# Patient Record
Sex: Female | Born: 1985 | Race: White | Hispanic: No | Marital: Married | Smoking: Never smoker
Health system: Southern US, Community
[De-identification: ages and names within clinical notes are randomized; demographics above are authoritative.]

## PROBLEM LIST (undated history)

## (undated) DIAGNOSIS — Z22330 Carrier of Group B streptococcus: Secondary | ICD-10-CM

## (undated) DIAGNOSIS — E669 Obesity, unspecified: Secondary | ICD-10-CM

## (undated) DIAGNOSIS — J302 Other seasonal allergic rhinitis: Secondary | ICD-10-CM

## (undated) HISTORY — DX: Carrier of group B Streptococcus: Z22.330

## (undated) HISTORY — DX: Obesity, unspecified: E66.9

## (undated) HISTORY — DX: Other seasonal allergic rhinitis: J30.2

---
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
## 2005-08-07 ENCOUNTER — Emergency Department: Payer: Self-pay | Admitting: Unknown Physician Specialty

## 2005-08-13 ENCOUNTER — Emergency Department: Payer: Self-pay | Admitting: Emergency Medicine

## 2006-12-02 ENCOUNTER — Ambulatory Visit: Payer: Self-pay | Admitting: Internal Medicine

## 2007-01-07 ENCOUNTER — Ambulatory Visit: Payer: Self-pay | Admitting: Family Medicine

## 2009-02-20 ENCOUNTER — Ambulatory Visit: Payer: Self-pay | Admitting: General Practice

## 2009-02-20 ENCOUNTER — Ambulatory Visit: Payer: Self-pay | Admitting: Physician Assistant

## 2011-02-15 IMAGING — CT CT CHEST W/ CM
2 series · 16 of 31 positions shown, 20 images · non-contrast
Comparison: none

REASON FOR EXAM: call report [id number]  Tachycardia  SOB  positve high D Dimer
COMMENTS:

PROCEDURE:     CT  - CT CHEST (FOR PE) W  - February 20, 2009  [DATE]
RESULT:     Chest CT dated 02/20/2009
TECHNIQUE: Helical 3 mm sections were obtained from the thoracic inlet to
the lung bases status post intravenous administration of 100 mL of
[id number].

[Series 4: soft tissue · axial · 0.74mm/px · z∈[-1129,-1048]mm · 3 of 89 slices shown]
[im 7/89  mediastinal]
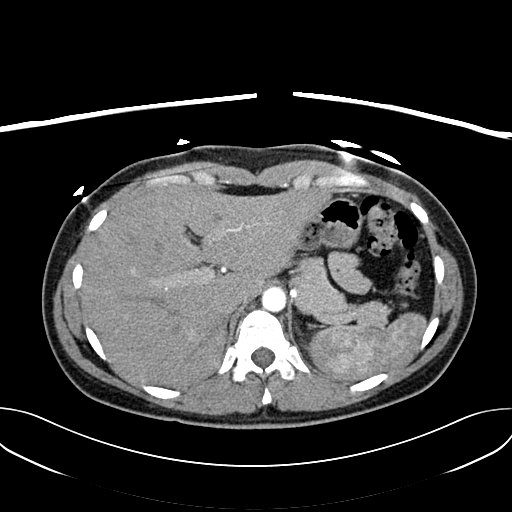
[im 21/89  mediastinal]
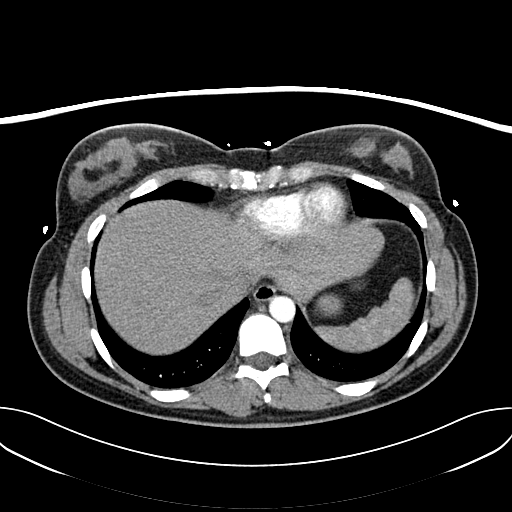
[im 34/89  mediastinal]
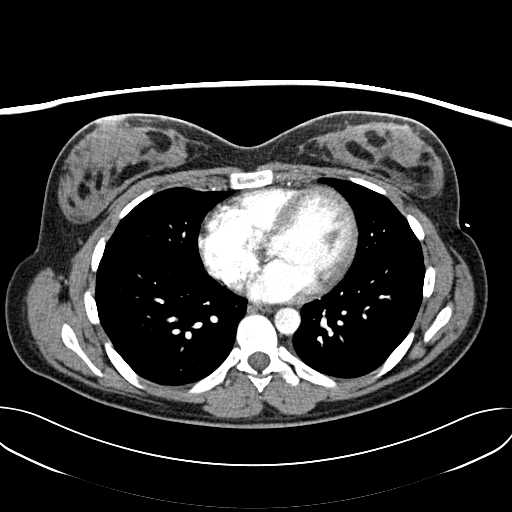

[Series 5: lung windows · axial · 0.74mm/px · z∈[-1123,-904]mm · 13 of 87 slices shown, 17 images]
[im 7/87  mediastinal]
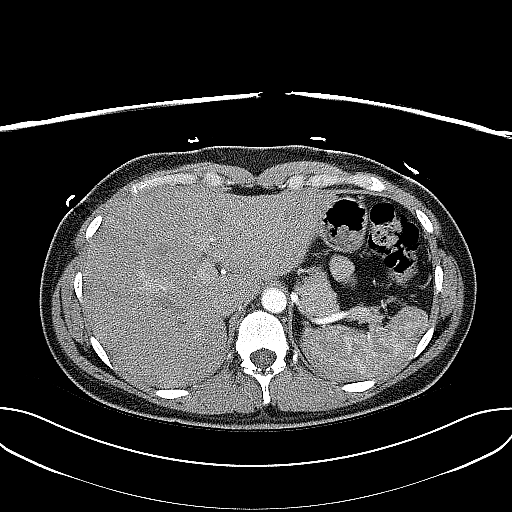
[im 7/87  lung]
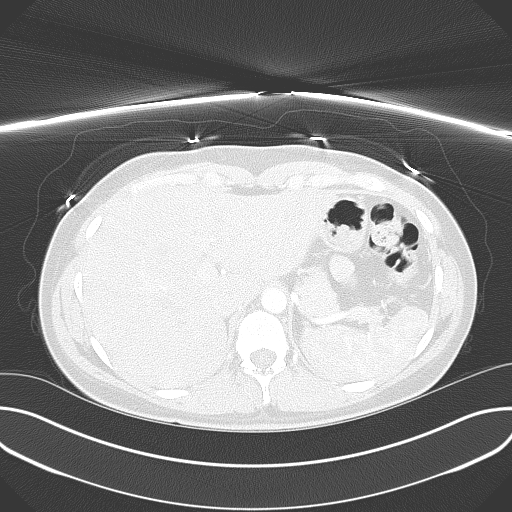
[im 14/87  lung]
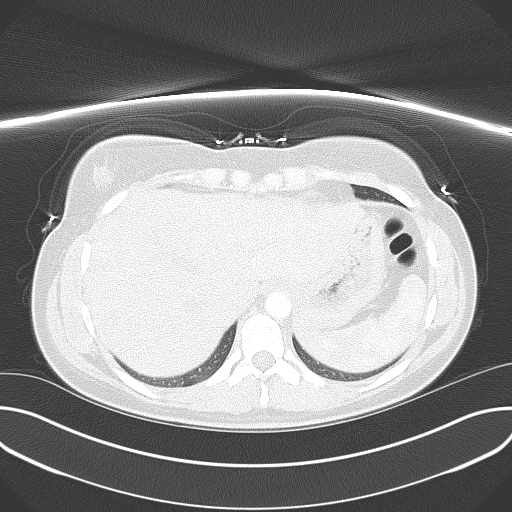
[im 20/87  lung]
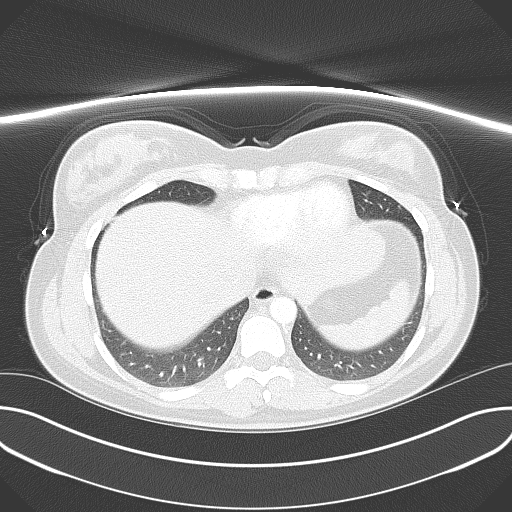
[im 27/87  lung]
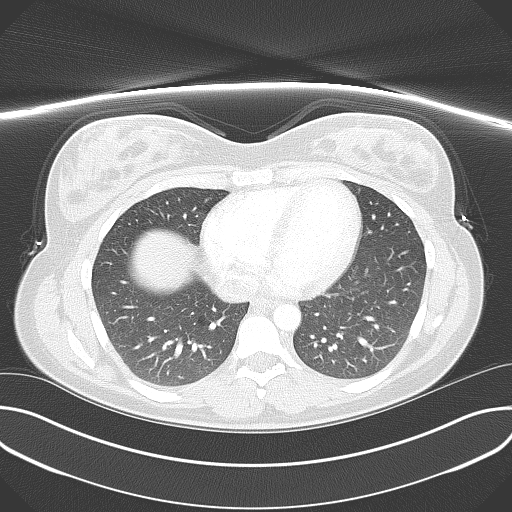
[im 34/87  mediastinal]
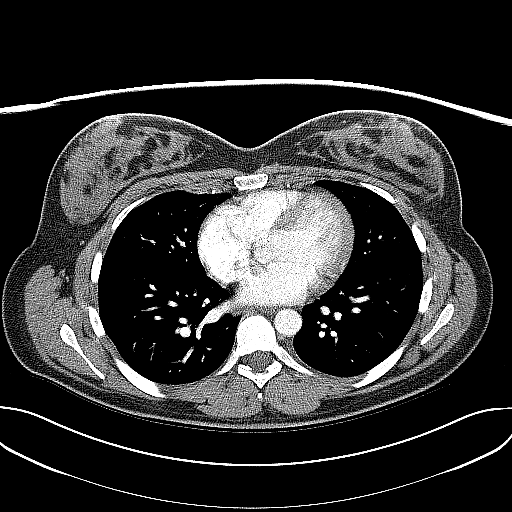
[im 34/87  lung]
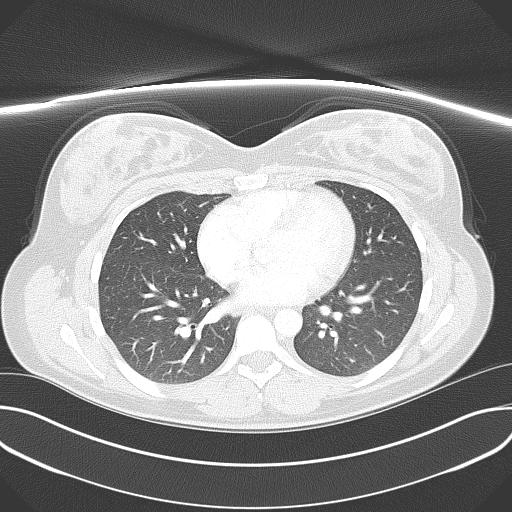
[im 40/87  lung]
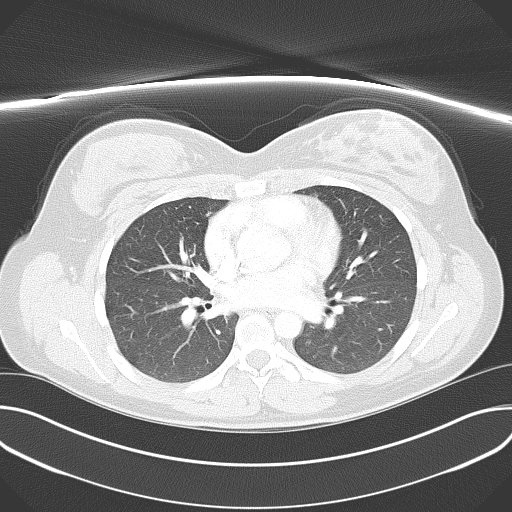
[im 44/87  lung]
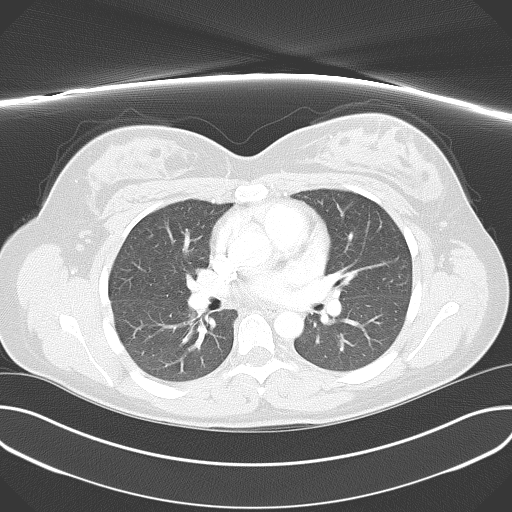
[im 47/87  lung]
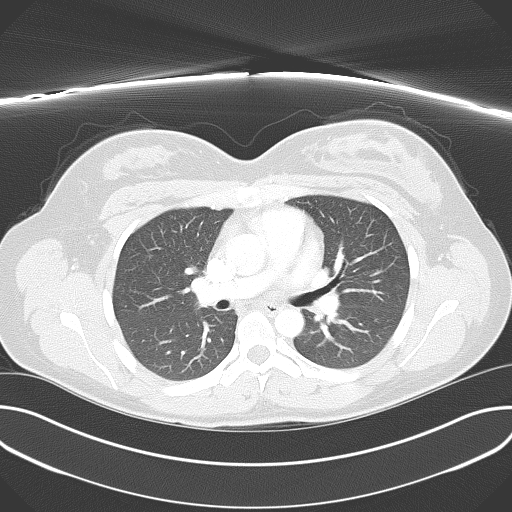
[im 53/87  mediastinal]
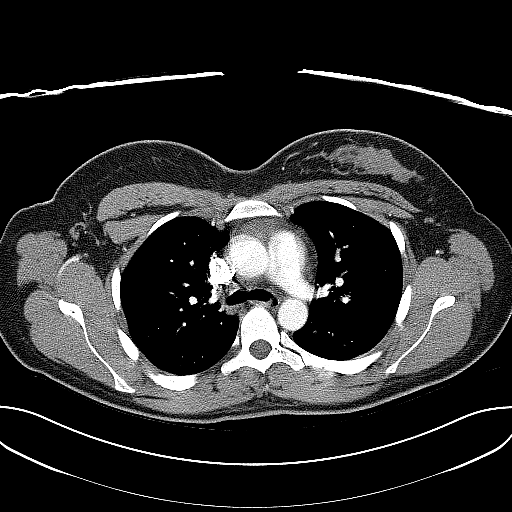
[im 53/87  lung]
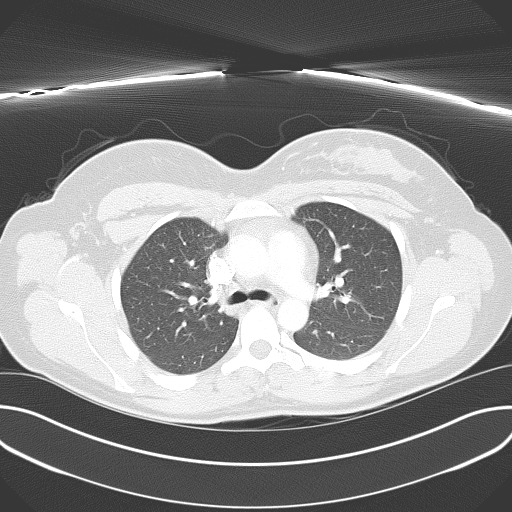
[im 60/87  lung]
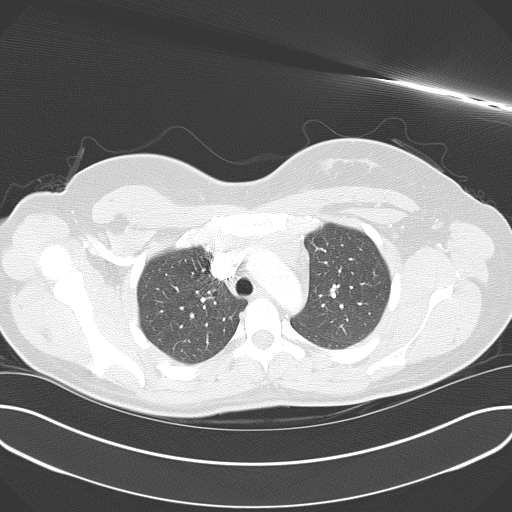
[im 67/87  lung]
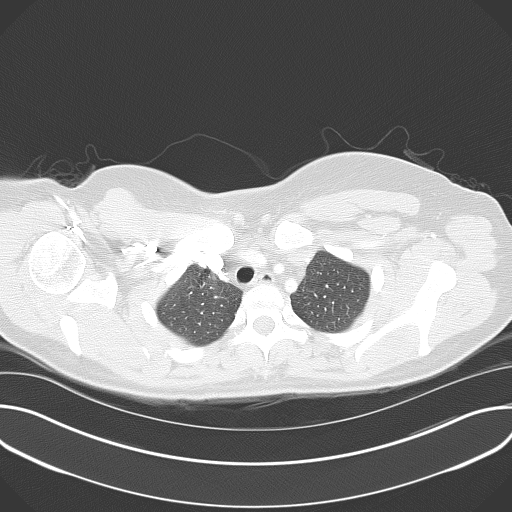
[im 73/87  lung]
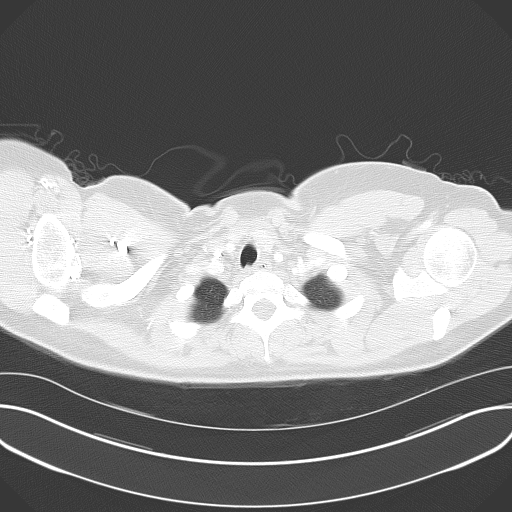
[im 80/87  mediastinal]
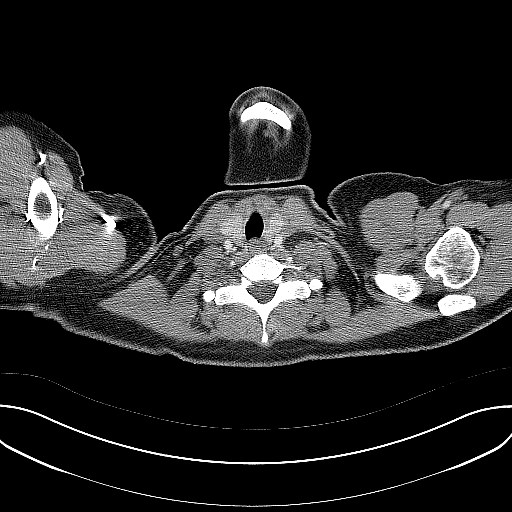
[im 80/87  lung]
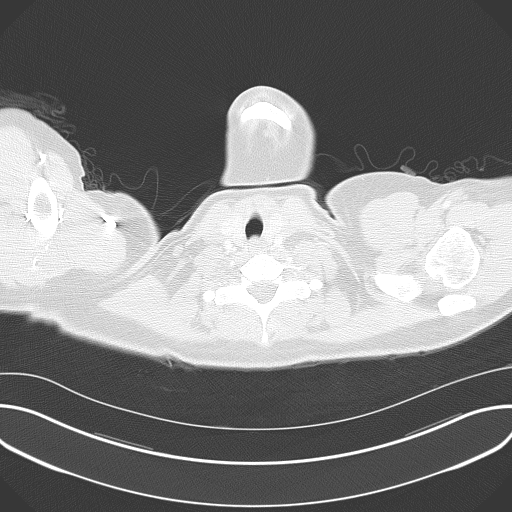

[16 of 31 positions shown; findings below may reference images not displayed]

FINDINGS: Evaluation of the mediastinum and hilar regions and structures demonstrates
no evidence of mediastinal nor hilar adenopathy nor masses. There is no
evidence of filling defects within the main, lobar, or segmental pulmonary
arteries.

The lung parenchyma demonstrate no evidence of focal infiltrates, effusions,
edema, masses, nor nodules.

The visualized upper abdominal viscera demonstrate no gross abnormalities.
IMPRESSION: No evidence of focal acute intrathoracic abnormalities.
2. There is no CT evidence of pulmonary arterial embolic disease.

## 2011-05-05 ENCOUNTER — Inpatient Hospital Stay: Payer: Self-pay | Admitting: Obstetrics and Gynecology

## 2011-05-05 LAB — CBC WITH DIFFERENTIAL/PLATELET
Basophil #: 0 10*3/uL (ref 0.0–0.1)
Eosinophil #: 0.2 10*3/uL (ref 0.0–0.7)
Lymphocyte #: 2.7 10*3/uL (ref 1.0–3.6)
MCV: 87 fL (ref 80–100)
Monocyte #: 1.2 10*3/uL — ABNORMAL HIGH (ref 0.0–0.7)
Monocyte %: 7.7 %
Neutrophil #: 11.7 10*3/uL — ABNORMAL HIGH (ref 1.4–6.5)
Neutrophil %: 73.5 %
Platelet: 277 10*3/uL (ref 150–440)
RBC: 3.77 10*6/uL — ABNORMAL LOW (ref 3.80–5.20)
RDW: 15.7 % — ABNORMAL HIGH (ref 11.5–14.5)
WBC: 15.9 10*3/uL — ABNORMAL HIGH (ref 3.6–11.0)

## 2014-02-23 NOTE — L&D Delivery Note (Signed)
Delivery Note At  a viable but suppressed female was delivered via  (Presentation:OP; with loose NCx1 reduced on perineum ).  APGAR:1 ,6, 9; weight 9#3oz  .   Placenta status:delivered intact , with 3 vessel Cord:  with the following complications:floopy with low heart rate, cord cut and placed on warmer and PPV applied vie SCN staff .  Cord pH: sent to lab  Anesthesia:  none Episiotomy:  none Lacerations:  2nd degree Suture Repair: 3.0 vicryl rapide Est. Blood Loss (mL):  300  Mom to postpartum.  Baby to warmer in room till stable then skin/skin care.  Leander Tout Burr 10/06/2014, 12:22 PM

## 2014-03-09 LAB — OB RESULTS CONSOLE HIV ANTIBODY (ROUTINE TESTING): HIV: NONREACTIVE

## 2014-03-09 LAB — OB RESULTS CONSOLE VARICELLA ZOSTER ANTIBODY, IGG: VARICELLA IGG: IMMUNE

## 2014-03-09 LAB — OB RESULTS CONSOLE ANTIBODY SCREEN: Antibody Screen: NEGATIVE

## 2014-03-09 LAB — OB RESULTS CONSOLE ABO/RH: RH Type: POSITIVE

## 2014-03-09 LAB — OB RESULTS CONSOLE HEPATITIS B SURFACE ANTIGEN: Hepatitis B Surface Ag: NEGATIVE

## 2014-03-09 LAB — OB RESULTS CONSOLE PLATELET COUNT: PLATELETS: 393 10*3/uL

## 2014-03-09 LAB — OB RESULTS CONSOLE HGB/HCT, BLOOD
HEMATOCRIT: 37 %
HEMOGLOBIN: 12 g/dL

## 2014-03-09 LAB — OB RESULTS CONSOLE GC/CHLAMYDIA
CHLAMYDIA, DNA PROBE: NEGATIVE
GC PROBE AMP, GENITAL: NEGATIVE

## 2014-03-09 LAB — OB RESULTS CONSOLE RUBELLA ANTIBODY, IGM: RUBELLA: IMMUNE

## 2014-03-09 LAB — OB RESULTS CONSOLE RPR: RPR: NONREACTIVE

## 2014-06-04 ENCOUNTER — Encounter: Payer: Self-pay | Admitting: *Deleted

## 2014-06-10 ENCOUNTER — Observation Stay
Admit: 2014-06-10 | Disposition: A | Discharge status: Home / Self Care | Payer: Self-pay | Attending: Obstetrics and Gynecology | Admitting: Obstetrics and Gynecology

## 2014-06-10 LAB — CBC WITH DIFFERENTIAL/PLATELET
BASOS PCT: 0.2 %
Basophil #: 0 10*3/uL (ref 0.0–0.1)
Eosinophil #: 0 10*3/uL (ref 0.0–0.7)
Eosinophil %: 0.2 %
HCT: 31.7 % — AB (ref 35.0–47.0)
HGB: 10.3 g/dL — AB (ref 12.0–16.0)
LYMPHS PCT: 7.7 %
Lymphocyte #: 0.7 10*3/uL — ABNORMAL LOW (ref 1.0–3.6)
MCH: 27.7 pg (ref 26.0–34.0)
MCHC: 32.5 g/dL (ref 32.0–36.0)
MCV: 85 fL (ref 80–100)
MONO ABS: 0.6 x10 3/mm (ref 0.2–0.9)
MONOS PCT: 6 %
NEUTROS PCT: 85.9 %
Neutrophil #: 7.9 10*3/uL — ABNORMAL HIGH (ref 1.4–6.5)
Platelet: 307 10*3/uL (ref 150–440)
RBC: 3.72 10*6/uL — AB (ref 3.80–5.20)
RDW: 13.3 % (ref 11.5–14.5)
WBC: 9.2 10*3/uL (ref 3.6–11.0)

## 2014-06-10 LAB — URINALYSIS, COMPLETE
BILIRUBIN, UR: NEGATIVE
BILIRUBIN, UR: NEGATIVE
Bacteria: NONE SEEN
Glucose,UR: NEGATIVE mg/dL (ref 0–75)
Glucose,UR: NEGATIVE mg/dL (ref 0–75)
KETONE: NEGATIVE
Leukocyte Esterase: NEGATIVE
Nitrite: NEGATIVE
Nitrite: NEGATIVE
PH: 5 (ref 4.5–8.0)
PH: 6 (ref 4.5–8.0)
Protein: NEGATIVE
SPECIFIC GRAVITY: 1.031 (ref 1.003–1.030)
SPECIFIC GRAVITY: 1.017 (ref 1.003–1.030)

## 2014-06-12 LAB — URINE CULTURE

## 2014-07-03 NOTE — H&P (Signed)
L&D Evaluation:  History Expanded:   HPI 29 yo G1 P0, EDD of 05/15/11 per LMP and 6 week US. [facility] to care, 3 gestational sacs with 1 fetus at 7 week US (one sac and one fetus at 7514 week US). Pt presents today at 38 4/7 weeks with c/o ctx, was 3 cm on admission and is now 3-4 per RN check. No LOF or VB. Good FM.    Blood Type O positive    Group B Strep Results (Result >5wks must be treated as unknown) negative    Maternal HIV Negative    Maternal Syphilis Ab Nonreactive    Maternal Varicella Immune    Rubella Results immune    Maternal T-Dap Immune    Patient's Medical History No Chronic Illness     Patient's Surgical History arm surgery after fracture as a child     Medications Pre Natal Vitamins     Allergies NKDA    Social History none   Exam:   Vital Signs 130s/80s    General no apparent distress    Mental Status clear    Chest clear    Heart no murmur/gallop/rubs    Abdomen gravid, tender with contractions    Estimated Fetal Weight Average for gestational age    Edema 1+  Nonpitting     Pelvic no external lesions    Mebranes Intact    FHT normal rate with no decels, 135 minimal variability after IV narcotics    Ucx regular   Impression:   Impression active labor   Plan:   Plan monitor contractions and for cervical change    Comments Getting epidural, will check afterwards   Electronic Signatures: Vella KohlerBrothers, Valentin Benney K (CNM)  (Signed 12-Mar-13 08:33)  Authored: L&D Evaluation   Last Updated: 12-Mar-13 08:33 by Vella KohlerBrothers, Konner Saiz K (CNM)

## 2014-07-27 ENCOUNTER — Encounter: Payer: Self-pay | Admitting: Obstetrics and Gynecology

## 2014-07-31 ENCOUNTER — Other Ambulatory Visit: Payer: Self-pay | Admitting: Obstetrics and Gynecology

## 2014-07-31 DIAGNOSIS — O99012 Anemia complicating pregnancy, second trimester: Secondary | ICD-10-CM

## 2014-07-31 MED ORDER — FUSION PLUS PO CAPS: 1.0000 | ORAL_CAPSULE | ORAL | Status: DC

## 2014-07-31 NOTE — Progress Notes (Signed)
Quick Note:  Please let her know she passed her glucola, but is anemic, need additional iron daily if not already on it ______

## 2014-08-01 NOTE — Progress Notes (Signed)
Notified pt of results, rx sent to [facility]

## 2014-08-13 ENCOUNTER — Encounter: Payer: Self-pay | Admitting: *Deleted

## 2014-08-16 ENCOUNTER — Ambulatory Visit (INDEPENDENT_AMBULATORY_CARE_PROVIDER_SITE_OTHER): Payer: 59 | Admitting: Obstetrics and Gynecology

## 2014-08-16 VITALS — BP 129/78 | HR 94 | Wt 221.6 lb

## 2014-08-16 DIAGNOSIS — Z3493 Encounter for supervision of normal pregnancy, unspecified, third trimester: Secondary | ICD-10-CM

## 2014-08-16 LAB — POCT URINALYSIS DIPSTICK
Bilirubin, UA: NEGATIVE
GLUCOSE UA: NEGATIVE
Ketones, UA: NEGATIVE
Leukocytes, UA: NEGATIVE
NITRITE UA: NEGATIVE
PROTEIN UA: NEGATIVE
RBC UA: NEGATIVE
SPEC GRAV UA: 1.01
UROBILINOGEN UA: 0.2
pH, UA: 6.5

## 2014-08-16 NOTE — Progress Notes (Signed)
ROB- doing well, no concerns. Plans to name infant 'Addison', and plans permnant steralization PP but possibly will be spouse vs her- OK if has a c/s to tie tubes at that time.

## 2014-08-16 NOTE — Progress Notes (Signed)
Pt is still having some leg cramps

## 2014-08-28 ENCOUNTER — Encounter: Payer: Self-pay | Admitting: Obstetrics and Gynecology

## 2014-08-28 ENCOUNTER — Ambulatory Visit (INDEPENDENT_AMBULATORY_CARE_PROVIDER_SITE_OTHER): Payer: 59 | Admitting: Obstetrics and Gynecology

## 2014-08-28 VITALS — BP 114/81 | HR 93 | Wt 221.0 lb

## 2014-08-28 DIAGNOSIS — Z3493 Encounter for supervision of normal pregnancy, unspecified, third trimester: Secondary | ICD-10-CM

## 2014-08-28 LAB — POCT URINALYSIS DIPSTICK
BILIRUBIN UA: NEGATIVE
GLUCOSE UA: NEGATIVE
Ketones, UA: NEGATIVE
NITRITE UA: NEGATIVE
PH UA: 7
RBC UA: NEGATIVE
SPEC GRAV UA: 1.01
UROBILINOGEN UA: 0.2

## 2014-08-28 NOTE — Progress Notes (Signed)
Pt denies any changes   

## 2014-08-28 NOTE — Progress Notes (Signed)
ROB- doing well w/o concerns; plans epidural in labor, delivered first child at 38w w/o complications.

## 2014-09-14 ENCOUNTER — Encounter: Payer: Self-pay | Admitting: Obstetrics and Gynecology

## 2014-09-14 ENCOUNTER — Ambulatory Visit (INDEPENDENT_AMBULATORY_CARE_PROVIDER_SITE_OTHER): Payer: 59 | Admitting: Obstetrics and Gynecology

## 2014-09-14 VITALS — BP 118/80 | HR 92 | Wt 225.7 lb

## 2014-09-14 DIAGNOSIS — Z3685 Encounter for antenatal screening for Streptococcus B: Secondary | ICD-10-CM

## 2014-09-14 DIAGNOSIS — Z113 Encounter for screening for infections with a predominantly sexual mode of transmission: Secondary | ICD-10-CM

## 2014-09-14 DIAGNOSIS — Z3493 Encounter for supervision of normal pregnancy, unspecified, third trimester: Secondary | ICD-10-CM

## 2014-09-14 DIAGNOSIS — Z36 Encounter for antenatal screening of mother: Secondary | ICD-10-CM

## 2014-09-14 LAB — POCT URINALYSIS DIPSTICK
BILIRUBIN UA: NEGATIVE
Blood, UA: NEGATIVE
Glucose, UA: NEGATIVE
Ketones, UA: NEGATIVE
LEUKOCYTES UA: NEGATIVE
NITRITE UA: NEGATIVE
PH UA: 6.5
Protein, UA: NEGATIVE
SPEC GRAV UA: 1.01
Urobilinogen, UA: 0.2

## 2014-09-14 LAB — OB RESULTS CONSOLE GBS: STREP GROUP B AG: POSITIVE

## 2014-09-14 NOTE — Progress Notes (Signed)
Pt denies any complaints.

## 2014-09-14 NOTE — Progress Notes (Signed)
ROB- doing well, reports irregular BHC, labor precautions discussed, cultures obtained

## 2014-09-15 LAB — GC/CHLAMYDIA PROBE AMP
CHLAMYDIA, DNA PROBE: NEGATIVE
Neisseria gonorrhoeae by PCR: NEGATIVE

## 2014-09-16 LAB — STREP GP B NAA: STREP GROUP B AG: POSITIVE — AB

## 2014-09-18 ENCOUNTER — Other Ambulatory Visit: Payer: Self-pay | Admitting: Obstetrics and Gynecology

## 2014-09-18 DIAGNOSIS — O99820 Streptococcus B carrier state complicating pregnancy: Secondary | ICD-10-CM

## 2014-09-21 ENCOUNTER — Ambulatory Visit (INDEPENDENT_AMBULATORY_CARE_PROVIDER_SITE_OTHER): Payer: 59 | Admitting: Obstetrics and Gynecology

## 2014-09-21 ENCOUNTER — Encounter: Payer: Self-pay | Admitting: Obstetrics and Gynecology

## 2014-09-21 VITALS — BP 109/78 | HR 112 | Wt 223.5 lb

## 2014-09-21 DIAGNOSIS — Z3493 Encounter for supervision of normal pregnancy, unspecified, third trimester: Secondary | ICD-10-CM

## 2014-09-21 LAB — POCT URINALYSIS DIPSTICK
BILIRUBIN UA: NEGATIVE
GLUCOSE UA: NEGATIVE
KETONES UA: NEGATIVE
NITRITE UA: NEGATIVE
PH UA: 6.5
Spec Grav, UA: 1.01
Urobilinogen, UA: 0.2

## 2014-09-21 NOTE — Progress Notes (Signed)
Pt is c/o scratchy throat, slight achy body, some swelling in feet especially @ work

## 2014-09-21 NOTE — Progress Notes (Signed)
ROB-reviewed GBS + status and antibiotic use in labor, to push fluids, keep taking zyrtec and tylenol for cold symptoms;

## 2014-09-27 ENCOUNTER — Ambulatory Visit (INDEPENDENT_AMBULATORY_CARE_PROVIDER_SITE_OTHER): Payer: 59 | Admitting: Obstetrics and Gynecology

## 2014-09-27 ENCOUNTER — Encounter: Payer: Self-pay | Admitting: Obstetrics and Gynecology

## 2014-09-27 VITALS — BP 106/76 | HR 110 | Wt 227.2 lb

## 2014-09-27 DIAGNOSIS — Z3493 Encounter for supervision of normal pregnancy, unspecified, third trimester: Secondary | ICD-10-CM

## 2014-09-27 LAB — POCT URINALYSIS DIPSTICK
BILIRUBIN UA: NEGATIVE
Glucose, UA: NEGATIVE
Ketones, UA: NEGATIVE
Leukocytes, UA: NEGATIVE
Nitrite, UA: NEGATIVE
PH UA: 5
PROTEIN UA: NEGATIVE
SPEC GRAV UA: 1.01
Urobilinogen, UA: 0.2

## 2014-09-27 NOTE — Progress Notes (Signed)
ROB- sinus pressure is biggest complaint, labor precautions reiterated.

## 2014-09-27 NOTE — Progress Notes (Signed)
Pt saw Dr.Mcqueen 09/26/2014 for sinus issues, some low back pain Has a lot of green mucous and isnt sleeping at all

## 2014-10-05 ENCOUNTER — Ambulatory Visit (INDEPENDENT_AMBULATORY_CARE_PROVIDER_SITE_OTHER): Payer: 59 | Admitting: Obstetrics and Gynecology

## 2014-10-05 ENCOUNTER — Encounter: Payer: Self-pay | Admitting: Obstetrics and Gynecology

## 2014-10-05 VITALS — BP 117/82 | HR 94 | Wt 228.0 lb

## 2014-10-05 DIAGNOSIS — Z3493 Encounter for supervision of normal pregnancy, unspecified, third trimester: Secondary | ICD-10-CM

## 2014-10-05 LAB — POCT URINALYSIS DIPSTICK
Bilirubin, UA: NEGATIVE
Glucose, UA: NEGATIVE
KETONES UA: NEGATIVE
Leukocytes, UA: NEGATIVE
Nitrite, UA: NEGATIVE
PROTEIN UA: NEGATIVE
RBC UA: NEGATIVE
SPEC GRAV UA: 1.01
Urobilinogen, UA: 0.2
pH, UA: 6.5

## 2014-10-05 NOTE — Progress Notes (Signed)
ROB-pt has been having contractions all am, pelvic pressure

## 2014-10-05 NOTE — Progress Notes (Signed)
ROB- reports irreg contractions all morning, labor precautions reiterated

## 2014-10-06 ENCOUNTER — Inpatient Hospital Stay
Admission: RE | Admit: 2014-10-06 | Discharge: 2014-10-08 | DRG: 775 | Disposition: A | Discharge status: Home / Self Care | Payer: 59 | Source: Ambulatory Visit | Attending: Obstetrics and Gynecology | Admitting: Obstetrics and Gynecology

## 2014-10-06 DIAGNOSIS — Z3A38 38 weeks gestation of pregnancy: Secondary | ICD-10-CM | POA: Diagnosis present

## 2014-10-06 DIAGNOSIS — Z79899 Other long term (current) drug therapy: Secondary | ICD-10-CM

## 2014-10-06 DIAGNOSIS — O9081 Anemia of the puerperium: Secondary | ICD-10-CM | POA: Diagnosis present

## 2014-10-06 DIAGNOSIS — Z888 Allergy status to other drugs, medicaments and biological substances status: Secondary | ICD-10-CM | POA: Diagnosis not present

## 2014-10-06 DIAGNOSIS — O99824 Streptococcus B carrier state complicating childbirth: Secondary | ICD-10-CM | POA: Diagnosis present

## 2014-10-06 DIAGNOSIS — Z3493 Encounter for supervision of normal pregnancy, unspecified, third trimester: Secondary | ICD-10-CM

## 2014-10-06 LAB — ABO/RH: ABO/RH(D): O POS

## 2014-10-06 LAB — TYPE AND SCREEN
ABO/RH(D): O POS
Antibody Screen: NEGATIVE

## 2014-10-06 LAB — CBC
HCT: 32.1 % — ABNORMAL LOW (ref 35.0–47.0)
HEMOGLOBIN: 10.6 g/dL — AB (ref 12.0–16.0)
MCH: 26.4 pg (ref 26.0–34.0)
MCHC: 33.1 g/dL (ref 32.0–36.0)
MCV: 79.7 fL — ABNORMAL LOW (ref 80.0–100.0)
PLATELETS: 421 10*3/uL (ref 150–440)
RBC: 4.03 MIL/uL (ref 3.80–5.20)
RDW: 15.4 % — ABNORMAL HIGH (ref 11.5–14.5)
WBC: 16 10*3/uL — AB (ref 3.6–11.0)

## 2014-10-06 MED ORDER — OXYCODONE-ACETAMINOPHEN 5-325 MG PO TABS
1.0000 | ORAL_TABLET | ORAL | Status: DC | PRN
  Administered 2014-10-07 (×2): 1 via ORAL
  Filled 2014-10-06 (×2): qty 1

## 2014-10-06 MED ORDER — OXYTOCIN 40 UNITS IN LACTATED RINGERS INFUSION - SIMPLE MED
62.5000 mL/h | INTRAVENOUS | Status: DC
  Administered 2014-10-06: 999 mL/h via INTRAVENOUS
  Filled 2014-10-06: qty 1000

## 2014-10-06 MED ORDER — OXYCODONE-ACETAMINOPHEN 5-325 MG PO TABS
1.0000 | ORAL_TABLET | ORAL | Status: DC | PRN
  Administered 2014-10-06: 1 via ORAL
  Filled 2014-10-06: qty 1

## 2014-10-06 MED ORDER — LACTATED RINGERS IV SOLN: 500.0000 mL | INTRAVENOUS | Status: DC | PRN

## 2014-10-06 MED ORDER — ACETAMINOPHEN 325 MG PO TABS: 650.0000 mg | ORAL_TABLET | ORAL | Status: DC | PRN

## 2014-10-06 MED ORDER — AMMONIA AROMATIC IN INHA
RESPIRATORY_TRACT | Status: AC
  Filled 2014-10-06: qty 10

## 2014-10-06 MED ORDER — OXYTOCIN BOLUS FROM INFUSION: 500.0000 mL | INTRAVENOUS | Status: DC

## 2014-10-06 MED ORDER — BENZOCAINE-MENTHOL 20-0.5 % EX AERO: 1.0000 "application " | INHALATION_SPRAY | CUTANEOUS | Status: DC | PRN

## 2014-10-06 MED ORDER — SIMETHICONE 80 MG PO CHEW: 80.0000 mg | CHEWABLE_TABLET | ORAL | Status: DC | PRN

## 2014-10-06 MED ORDER — OXYCODONE-ACETAMINOPHEN 5-325 MG PO TABS
2.0000 | ORAL_TABLET | ORAL | Status: DC | PRN
  Administered 2014-10-06 – 2014-10-08 (×3): 2 via ORAL
  Filled 2014-10-06 (×2): qty 2

## 2014-10-06 MED ORDER — ONDANSETRON HCL 4 MG PO TABS: 4.0000 mg | ORAL_TABLET | ORAL | Status: DC | PRN

## 2014-10-06 MED ORDER — WITCH HAZEL-GLYCERIN EX PADS: 1.0000 "application " | MEDICATED_PAD | CUTANEOUS | Status: DC | PRN

## 2014-10-06 MED ORDER — ONDANSETRON HCL 4 MG/2ML IJ SOLN: 4.0000 mg | Freq: Four times a day (QID) | INTRAMUSCULAR | Status: DC | PRN

## 2014-10-06 MED ORDER — SODIUM CHLORIDE 0.9 % IV SOLN
2.0000 g | Freq: Once | INTRAVENOUS | Status: AC
  Administered 2014-10-06: 2 g via INTRAVENOUS

## 2014-10-06 MED ORDER — BENZOCAINE-MENTHOL 20-0.5 % EX AERO
INHALATION_SPRAY | CUTANEOUS | Status: AC
  Filled 2014-10-06: qty 56

## 2014-10-06 MED ORDER — OXYTOCIN 10 UNIT/ML IJ SOLN
INTRAMUSCULAR | Status: AC
  Filled 2014-10-06: qty 2

## 2014-10-06 MED ORDER — IBUPROFEN 600 MG PO TABS
600.0000 mg | ORAL_TABLET | Freq: Four times a day (QID) | ORAL | Status: DC
  Administered 2014-10-06 – 2014-10-08 (×8): 600 mg via ORAL
  Filled 2014-10-06 (×8): qty 1

## 2014-10-06 MED ORDER — LACTATED RINGERS IV SOLN
INTRAVENOUS | Status: DC
  Administered 2014-10-06: 11:00:00 via INTRAVENOUS

## 2014-10-06 MED ORDER — LANOLIN HYDROUS EX OINT: TOPICAL_OINTMENT | CUTANEOUS | Status: DC | PRN

## 2014-10-06 MED ORDER — SODIUM CHLORIDE 0.9 % IV SOLN
1.0000 g | Freq: Four times a day (QID) | INTRAVENOUS | Status: DC
  Filled 2014-10-06 (×4): qty 1000

## 2014-10-06 MED ORDER — SODIUM CHLORIDE 0.9 % IV SOLN
INTRAVENOUS | Status: AC
  Administered 2014-10-06: 2 g via INTRAVENOUS
  Filled 2014-10-06: qty 2000

## 2014-10-06 MED ORDER — DIPHENHYDRAMINE HCL 25 MG PO CAPS: 25.0000 mg | ORAL_CAPSULE | Freq: Four times a day (QID) | ORAL | Status: DC | PRN

## 2014-10-06 MED ORDER — PRENATAL MULTIVITAMIN CH
1.0000 | ORAL_TABLET | Freq: Every day | ORAL | Status: DC
  Filled 2014-10-06 (×2): qty 1

## 2014-10-06 MED ORDER — DIBUCAINE 1 % RE OINT: 1.0000 "application " | TOPICAL_OINTMENT | RECTAL | Status: DC | PRN

## 2014-10-06 MED ORDER — MISOPROSTOL 200 MCG PO TABS
ORAL_TABLET | ORAL | Status: AC
  Filled 2014-10-06: qty 4

## 2014-10-06 MED ORDER — OXYCODONE-ACETAMINOPHEN 5-325 MG PO TABS
2.0000 | ORAL_TABLET | ORAL | Status: DC | PRN
  Filled 2014-10-06: qty 2

## 2014-10-06 MED ORDER — ONDANSETRON HCL 4 MG/2ML IJ SOLN: 4.0000 mg | INTRAMUSCULAR | Status: DC | PRN

## 2014-10-06 MED ORDER — OXYTOCIN 40 UNITS IN LACTATED RINGERS INFUSION - SIMPLE MED: 62.5000 mL/h | INTRAVENOUS | Status: DC | PRN

## 2014-10-06 MED ORDER — SENNOSIDES-DOCUSATE SODIUM 8.6-50 MG PO TABS
2.0000 | ORAL_TABLET | ORAL | Status: DC
  Administered 2014-10-06 – 2014-10-08 (×2): 2 via ORAL
  Filled 2014-10-06 (×2): qty 2

## 2014-10-06 MED ORDER — LIDOCAINE HCL (PF) 1 % IJ SOLN
INTRAMUSCULAR | Status: AC
  Filled 2014-10-06: qty 30

## 2014-10-06 NOTE — Progress Notes (Signed)
Pt transferred to m/b unit.  Vital signs stable, fundus firm, bleeding WNL. Report given to Shirlean Kelly, RN.

## 2014-10-06 NOTE — H&P (Signed)
Obstetric History and Physical  Michele Hurley is a 29 y.o. G2P1001 with IUP at [redacted]w[redacted]d presenting with contractions. Patient states she has been having  regular, every 4 minutes contractions, minimal vaginal bleeding, intact membranes, with active fetal movement.    Prenatal Course Source of Care: [facility]  Pregnancy complications or risks:+GBS  Prenatal labs and studies: ABO, Rh: --/--/PENDING (08/13 1058) Antibody: PENDING (08/13 1058) Rubella: Immune (01/15 0000) RPR: Nonreactive (01/15 0000)  HBsAg: Negative (01/15 0000)  HIV: Non-reactive (01/15 0000)  GBS:  1 hr Glucola  normal Genetic screening normal Anatomy US normal  Past Medical History  Diagnosis Date  . GBS carrier   . Obesity   . Seasonal allergies     No past surgical history on file.  OB History  Gravida Para Term Preterm AB SAB TAB Ectopic Multiple Living  # Outcome Date GA Lbr Len/2nd Weight Sex Delivery Anes PTL Lv  2 Current           1 Term 2013    M Vag-Spont   Y      Social History   Social History  . Marital Status: Married    Spouse Name: N/A  . Number of Children: N/A  . Years of Education: N/A   Social History Main Topics  . Smoking status: Never Smoker   . Smokeless tobacco: Never Used  . Alcohol Use: No  . Drug Use: No  . Sexual Activity: Yes    Birth Control/ Protection: None   Other Topics Concern  . Not on file   Social History Narrative    No family history on file.  Prescriptions prior to admission  Medication Sig Dispense Refill Last Dose  . cefdinir (OMNICEF) 300 MG capsule Take 300 mg by mouth 2 (two) times daily.   Not Taking  . docusate sodium (COLACE) 100 MG capsule Take 100 mg by mouth 2 (two) times daily.   Taking  . Iron-FA-B Cmp-C-Biot-Probiotic (FUSION PLUS) CAPS Take 1 capsule by mouth 1 day or 1 dose. 30 capsule 3 Taking  . Prenatal Vit-Fe Fumarate-FA (PRENATAL MULTIVITAMIN) TABS tablet Take 1 tablet by mouth daily at 12 noon.   Taking     Allergies  Allergen Reactions  . Ceclor [Cefaclor] Rash    Review of Systems: Negative except for what is mentioned in HPI.  Physical Exam: BP 130/87 mmHg  Pulse 110  Temp(Src) 98.3 F (36.8 C) (Oral)  Resp 20  Ht  (1.676 m)  Wt 103.42 kg (228 lb)  BMI 36.82 kg/m2  LMP 01/07/2014 (LMP Unknown) GENERAL: Well-developed, well-nourished female in no acute distress.  LUNGS: Clear to auscultation bilaterally.  HEART: Regular rate and rhythm. ABDOMEN: Soft, nontender, nondistended, gravid. EXTREMITIES: Nontender, no edema, 2+ distal pulses. Cervical Exam: Dilation: 7.5 Effacement (%): 90 Station: -1 Presentation: Vertex Exam by:: Zerita Boers  FHT:  Baseline rate 140 bpm   Variability moderate  Accelerations present   Decelerations early Contractions: Every 3 mins   Pertinent Labs/Studies:   Results for orders placed or performed during the hospital encounter of 10/06/14 (from the past 24 hour(s))  Type and screen     Status: None (Preliminary result)   Collection Time: 10/06/14 10:58 AM  Result Value Ref Range   ABO/RH(D) PENDING    Antibody Screen PENDING    Sample Expiration 10/09/2014   CBC     Status: Abnormal   Collection Time: 10/06/14  10:59 AM  Result Value Ref Range   WBC 16.0 (H) 3.6 - 11.0 K/uL   RBC 4.03 3.80 - 5.20 MIL/uL   Hemoglobin 10.6 (L) 12.0 - 16.0 g/dL   HCT 96.0 (L) 45.4 - 09.8 %   MCV 79.7 (L) 80.0 - 100.0 fL   MCH 26.4 26.0 - 34.0 pg   MCHC 33.1 32.0 - 36.0 g/dL   RDW 11.9 (H) 14.7 - 82.9 %   Platelets 421 150 - 440 K/uL    Assessment : Michele Hurley is a 29 y.o. G2P1001 at [redacted]w[redacted]d being admitted for labor.  Plan: Labor: Expectant management.  Induction/Augmentation as needed, per protocol FWB: Reassuring fetal heart tracing.  GBS positive Delivery plan: Hopeful for vaginal delivery  Melody Ines Bloomer, CNM [facility], [facility]

## 2014-10-06 NOTE — OB Triage Note (Signed)
[facility] with contractions since last night, increasing in intensity at around 10AM. Denies LOF, reports some spotting. Rates pain 8/10, pain primarily in back.

## 2014-10-07 LAB — CBC
HEMATOCRIT: 26.5 % — AB (ref 35.0–47.0)
HEMOGLOBIN: 8.4 g/dL — AB (ref 12.0–16.0)
MCH: 25.3 pg — ABNORMAL LOW (ref 26.0–34.0)
MCHC: 31.7 g/dL — AB (ref 32.0–36.0)
MCV: 79.9 fL — ABNORMAL LOW (ref 80.0–100.0)
Platelets: 336 10*3/uL (ref 150–440)
RBC: 3.32 MIL/uL — AB (ref 3.80–5.20)
RDW: 15.3 % — AB (ref 11.5–14.5)
WBC: 18.2 10*3/uL — AB (ref 3.6–11.0)

## 2014-10-07 LAB — RPR: RPR: NONREACTIVE

## 2014-10-07 NOTE — Progress Notes (Signed)
Post Partum Day 1 Subjective: no complaints, up ad lib and voiding  Objective: Blood pressure 107/69, pulse 78, temperature 98 F (36.7 C), temperature source Oral, resp. rate 14, height  (1.676 m), weight 103.42 kg (228 lb), last menstrual period 01/07/2014, SpO2 97 %, unknown if currently breastfeeding.  Physical Exam:  General: alert, cooperative and appears stated age Lochia: appropriate Uterine Fundus: firm Incision: NA DVT Evaluation: No evidence of DVT seen on physical exam. Negative Homan's sign.   Recent Labs  10/06/14 1059 10/07/14 0534  HGB 10.6* 8.4*  HCT 32.1* 26.5*    Assessment/Plan: Coomb's negative Plan for discharge tomorrow and Breastfeeding   LOS: 1 day   Michele Hurley 10/07/2014, 2:35 PM

## 2014-10-08 LAB — CBC
HEMATOCRIT: 27.2 % — AB (ref 35.0–47.0)
Hemoglobin: 8.8 g/dL — ABNORMAL LOW (ref 12.0–16.0)
MCH: 26.1 pg (ref 26.0–34.0)
MCHC: 32.2 g/dL (ref 32.0–36.0)
MCV: 81.1 fL (ref 80.0–100.0)
PLATELETS: 355 10*3/uL (ref 150–440)
RBC: 3.36 MIL/uL — ABNORMAL LOW (ref 3.80–5.20)
RDW: 15.3 % — AB (ref 11.5–14.5)
WBC: 11.3 10*3/uL — ABNORMAL HIGH (ref 3.6–11.0)

## 2014-10-08 NOTE — Discharge Summary (Signed)
Obstetric Discharge Summary Reason for Admission: onset of labor Prenatal Procedures: ultrasound Intrapartum Procedures: spontaneous vaginal delivery and GBS prophylaxis Postpartum Procedures: none Complications-Operative and Postpartum: 2 degree perineal laceration HEMOGLOBIN  Date Value Ref Range Status  10/08/2014 8.8* 12.0 - 16.0 g/dL Final  40/98/1191 47.8 g/dL Final   HGB  Date Value Ref Range Status  06/10/2014 10.3* 12.0-16.0 g/dL Final   HCT  Date Value Ref Range Status  10/08/2014 27.2* 35.0 - 47.0 % Final  06/10/2014 31.7* 35.0-47.0 % Final  03/09/2014 37 % Final    Physical Exam:  General: alert, cooperative, appears stated age and pale Lochia: appropriate Uterine Fundus: firm Incision: NA DVT Evaluation: No evidence of DVT seen on physical exam. Negative Homan's sign.  Discharge Diagnoses: Term Pregnancy-delivered and PP anemia  Discharge Information: Date: 10/08/2014 Activity: pelvic rest Diet: routine Medications: PNV, Ibuprofen, Colace and Iron Condition: stable Instructions: refer to practice specific booklet and to call and schedule appointment in 3-4 weeks for PPTL preop with one of physicians Discharge to: home   Newborn Data: Live born female Addison Birth Weight: 9 lb 3.4 oz (4179 g) APGAR: 1, 6  Home with mother.  Melody Burr 10/08/2014, 11:16 AM

## 2014-10-08 NOTE — Discharge Instructions (Signed)
Laparoscopic Tubal Ligation Laparoscopic tubal ligation is a procedure that closes the fallopian tubes at a time other than right after childbirth. By closing the fallopian tubes, the eggs that are released from the ovaries cannot enter the uterus and sperm cannot reach the egg. Tubal ligation is also known as getting your "tubes tied." Tubal ligation is done so you will not be able to get pregnant or have a baby.  Although this procedure may be reversed, it should be considered permanent and irreversible. If you want to have future pregnancies, you should not have this procedure.  LET YOUR CAREGIVER KNOW ABOUT:  Allergies to food or medicine.  Medicines taken, including vitamins, herbs, eyedrops, over-the-counter medicines, and creams.  Use of steroids (by mouth or creams).  Previous problems with numbing medicines.  History of bleeding problems or blood clots.  Any recent colds or infections.  Previous surgery.  Other health problems, including diabetes and kidney problems.  Possibility of pregnancy, if this applies.  Any past pregnancies. RISKS AND COMPLICATIONS   Infection.  Bleeding.  Injury to surrounding organs.  Anesthetic side effects.  Failure of the procedure.  Ectopic pregnancy.  Future regret about having the procedure done. BEFORE THE PROCEDURE  Do not take aspirin or blood thinners a week before the procedure or as directed. This can cause bleeding.  Do not eat or drink anything 6 to 8 hours before the procedure. PROCEDURE   You may be given a medicine to help you relax (sedative) before the procedure. You will be given a medicine to make you sleep (general anesthetic) during the procedure.  A tube will be put down your throat to help your breath while under general anesthesia.  Two small cuts (incisions) are made in the lower abdominal area and near the belly button.  Your abdominal area will be inflated with a safe gas (carbon dioxide). This helps  give the surgeon room to operate, visualize, and helps the surgeon avoid other organs.  A thin, lighted tube (laparoscope) with a camera attached is inserted into your abdomen through one of the incisions near the belly button. Other small instruments are also inserted through the other abdominal incision.  The fallopian tubes are located and are either blocked with a ring, clip, or are burned (cauterized).  After the fallopian tubes are blocked, the gas is released from the abdomen.  The incisions will be closed with stitches (sutures), and a bandage may be placed over the incisions. AFTER THE PROCEDURE   You will rest in a recovery room for 1--4 hours until you are stable and doing well.  You will also have some mild abdominal discomfort for 3--7 days. You will be given pain medicine to ease any discomfort.  As long as there are no problems, you may be allowed to go home. Someone will need to drive you home and be with you for at least 24 hours once home.  You may have some mild discomfort in the throat. This is from the tube placed in your throat while you were sleeping.  You may experience discomfort in the shoulder area from some trapped air between the liver and diaphragm. This sensation is normal and will slowly go away on its own. Document Released: 05/18/2000 Document Revised: 08/11/2011 Document Reviewed: 05/23/2011 ExitCare Patient Information 2015 [employer]. This information is not intended to replace advice given to you by your health care provider. Make sure you discuss any questions you have with your health care provider.  

## 2014-10-12 ENCOUNTER — Encounter: Payer: 59 | Admitting: Obstetrics and Gynecology

## 2014-12-04 ENCOUNTER — Encounter: Payer: Self-pay | Admitting: Obstetrics and Gynecology

## 2014-12-04 ENCOUNTER — Ambulatory Visit (INDEPENDENT_AMBULATORY_CARE_PROVIDER_SITE_OTHER): Payer: 59 | Admitting: Obstetrics and Gynecology

## 2014-12-04 MED ORDER — INFLUENZA VAC SPLIT QUAD 0.5 ML IM SUSY
0.5000 mL | PREFILLED_SYRINGE | Freq: Once | INTRAMUSCULAR | Status: AC
  Administered 2014-12-04: 0.5 mL via INTRAMUSCULAR

## 2014-12-04 NOTE — Progress Notes (Signed)
  Subjective:     Michele Hurley is a 29 y.o. female who presents for a postpartum visit. She is 6 weeks postpartum following a spontaneous vaginal delivery. I have fully reviewed the prenatal and intrapartum course. The delivery was at 39 gestational weeks. Outcome: spontaneous vaginal delivery. Anesthesia: epidural. Postpartum course has been uneventful. Baby's course has been uneventful. Baby is feeding by breast. Bleeding no bleeding. Bowel function is normal. Bladder function is normal. Patient is not sexually active. Contraception method is abstinence. Postpartum depression screening: negative.  The following portions of the patient's history were reviewed and updated as appropriate: allergies, current medications, past family history, past medical history, past social history, past surgical history and problem list.  Review of Systems A comprehensive review of systems was negative.   Objective:    BP 112/70 mmHg  Pulse 94  Ht  (1.676 m)  Wt 216 lb 3.2 oz (98.068 kg)  BMI 34.91 kg/m2  Breastfeeding? Yes  General:  alert, cooperative, appears stated age and mildly obese   Breasts:  inspection negative, no nipple discharge or bleeding, no masses or nodularity palpable  Lungs: clear to auscultation bilaterally  Heart:  regular rate and rhythm, S1, S2 normal, no murmur, click, rub or gallop  Abdomen: soft, non-tender; bowel sounds normal; no masses,  no organomegaly   Vulva:  normal  Vagina: normal vagina, no discharge, exudate, lesion, or erythema  Cervix:  multiparous appearance  Corpus: normal size, contour, position, consistency, mobility, non-tender  Adnexa:  normal adnexa and no mass, fullness, tenderness  Rectal Exam: Not performed.        Assessment:     6 week postpartum exam. Pap smear not done at today's visit.   Plan:    1. Contraception: IUD- will return next week for Mirena insertion 2. Obesity breastfeeding 3. Follow up in: 4 months for AE or as needed.

## 2014-12-04 NOTE — Patient Instructions (Signed)
Levonorgestrel intrauterine device (IUD) What is this medicine? LEVONORGESTREL IUD (LEE voe nor jes trel) is a contraceptive (birth control) device. The device is placed inside the uterus by a healthcare professional. It is used to prevent pregnancy and can also be used to treat heavy bleeding that occurs during your period. Depending on the device, it can be used for 3 to 5 years. This medicine may be used for other purposes; ask your health care provider or pharmacist if you have questions. What should I tell my health care provider before I take this medicine? They need to know if you have any of these conditions: -abnormal Pap smear -cancer of the breast, uterus, or cervix -diabetes -endometritis -genital or pelvic infection now or in the past -have more than one sexual partner or your partner has more than one partner -heart disease -history of an ectopic or tubal pregnancy -immune system problems -IUD in place -liver disease or tumor -problems with blood clots or take blood-thinners -use intravenous drugs -uterus of unusual shape -vaginal bleeding that has not been explained -an unusual or allergic reaction to levonorgestrel, other hormones, silicone, or polyethylene, medicines, foods, dyes, or preservatives -pregnant or trying to get pregnant -breast-feeding How should I use this medicine? This device is placed inside the uterus by a health care professional. Talk to your pediatrician regarding the use of this medicine in children. Special care may be needed. Overdosage: If you think you have taken too much of this medicine contact a poison control center or emergency room at once. NOTE: This medicine is only for you. Do not share this medicine with others. What if I miss a dose? This does not apply. What may interact with this medicine? Do not take this medicine with any of the following medications: -amprenavir -bosentan -fosamprenavir This medicine may also interact with  the following medications: -aprepitant -barbiturate medicines for inducing sleep or treating seizures -bexarotene -griseofulvin -medicines to treat seizures like carbamazepine, ethotoin, felbamate, oxcarbazepine, phenytoin, topiramate -modafinil -pioglitazone -rifabutin -rifampin -rifapentine -some medicines to treat HIV infection like atazanavir, indinavir, lopinavir, nelfinavir, tipranavir, ritonavir -St. John's wort -warfarin This list may not describe all possible interactions. Give your health care provider a list of all the medicines, herbs, non-prescription drugs, or dietary supplements you use. Also tell them if you smoke, drink alcohol, or use illegal drugs. Some items may interact with your medicine. What should I watch for while using this medicine? Visit your doctor or health care professional for regular check ups. See your doctor if you or your partner has sexual contact with others, becomes HIV positive, or gets a sexual transmitted disease. This product does not protect you against HIV infection (AIDS) or other sexually transmitted diseases. You can check the placement of the IUD yourself by reaching up to the top of your vagina with clean fingers to feel the threads. Do not pull on the threads. It is a good habit to check placement after each menstrual period. Call your doctor right away if you feel more of the IUD than just the threads or if you cannot feel the threads at all. The IUD may come out by itself. You may become pregnant if the device comes out. If you notice that the IUD has come out use a backup birth control method like condoms and call your health care provider. Using tampons will not change the position of the IUD and are okay to use during your period. What side effects may I notice from receiving this medicine?   Side effects that you should report to your doctor or health care professional as soon as possible: -allergic reactions like skin rash, itching or  hives, swelling of the face, lips, or tongue -fever, flu-like symptoms -genital sores -high blood pressure -no menstrual period for 6 weeks during use -pain, swelling, warmth in the leg -pelvic pain or tenderness -severe or sudden headache -signs of pregnancy -stomach cramping -sudden shortness of breath -trouble with balance, talking, or walking -unusual vaginal bleeding, discharge -yellowing of the eyes or skin Side effects that usually do not require medical attention (report to your doctor or health care professional if they continue or are bothersome): -acne -breast pain -change in sex drive or performance -changes in weight -cramping, dizziness, or faintness while the device is being inserted -headache -irregular menstrual bleeding within first 3 to 6 months of use -nausea This list may not describe all possible side effects. Call your doctor for medical advice about side effects. You may report side effects to FDA at 1-800-FDA-1088. Where should I keep my medicine? This does not apply. NOTE: This sheet is a summary. It may not cover all possible information. If you have questions about this medicine, talk to your doctor, pharmacist, or health care provider.    2016, Elsevier/Gold Standard. (2011-03-12 13:54:04)  

## 2014-12-11 ENCOUNTER — Ambulatory Visit (INDEPENDENT_AMBULATORY_CARE_PROVIDER_SITE_OTHER): Payer: 59 | Admitting: Obstetrics and Gynecology

## 2014-12-11 ENCOUNTER — Encounter: Payer: Self-pay | Admitting: Obstetrics and Gynecology

## 2014-12-11 VITALS — BP 109/73 | HR 75 | Wt 217.6 lb

## 2014-12-11 DIAGNOSIS — Z30430 Encounter for insertion of intrauterine contraceptive device: Secondary | ICD-10-CM | POA: Diagnosis not present

## 2014-12-11 MED ORDER — LEVONORGESTREL 20 MCG/24HR IU IUD: 1.0000 | INTRAUTERINE_SYSTEM | Freq: Once | INTRAUTERINE | Status: AC

## 2014-12-11 NOTE — Progress Notes (Signed)
Patient ID: Ezekiel InaMelissa D Uptain, female   DOB: 04-May-1985, 29 y.o.   MRN: [id number] Ezekiel InaMelissa D Sexson is a 29 y.o. year old [id number] Caucasian female who presents for placement of a Mirena IUD.  No LMP recorded. BP 109/73 mmHg  Pulse 75  Wt 217 lb 9.6 oz (98.703 kg)  Breastfeeding? Yes Last sexual intercourse was > 6 weeks ago, and pregnancy test today was negative  The risks and benefits of the method and placement have been thouroughly reviewed with the patient and all questions were answered.  Specifically the patient is aware of failure rate of 02/998, expulsion of the IUD and of possible perforation.  The patient is aware of irregular bleeding due to the method and understands the incidence of irregular bleeding diminishes with time.  Signed copy of informed consent in chart.   Time out was performed.  A graves speculum was placed in the vagina.  The cervix was visualized, prepped using Betadine, and grasped with a single tooth tenaculum. The uterus was found to be neutral and it sounded to 9 cm.  Mirena IUD placed per manufacturer's recommendations.   The strings were trimmed to 3 cm.  The patient was given post procedure instructions, including signs and symptoms of infection and to check for the strings after each menses or each month, and refraining from intercourse or anything in the vagina for 3 days.  She was given a Mirena care card with date Mirena placed, and date Mirena to be removed.    Reilly Molchan Elissa LovettN Burr, CNM

## 2014-12-11 NOTE — Patient Instructions (Signed)

## 2015-04-05 ENCOUNTER — Encounter: Payer: Self-pay | Admitting: Obstetrics and Gynecology

## 2015-04-05 ENCOUNTER — Other Ambulatory Visit: Payer: Self-pay | Admitting: Obstetrics and Gynecology

## 2015-04-05 ENCOUNTER — Ambulatory Visit (INDEPENDENT_AMBULATORY_CARE_PROVIDER_SITE_OTHER): Payer: 59 | Admitting: Obstetrics and Gynecology

## 2015-04-05 VITALS — BP 112/80 | HR 78 | Ht 66.0 in | Wt 228.6 lb

## 2015-04-05 DIAGNOSIS — Z01419 Encounter for gynecological examination (general) (routine) without abnormal findings: Secondary | ICD-10-CM | POA: Diagnosis not present

## 2015-04-05 MED ORDER — NORETHINDRONE 0.35 MG PO TABS: 1.0000 | ORAL_TABLET | Freq: Every day | ORAL | Status: DC

## 2015-04-05 NOTE — Patient Instructions (Signed)
Place annual gynecologic exam patient instructions here.

## 2015-04-05 NOTE — Progress Notes (Signed)
  Subjective:     Michele Hurley is a 30 y.o. female and is here for a comprehensive physical exam. The patient reports spotting almost daily since Mirena insertion in October..  Social History   Social History  . Marital Status: Married    Spouse Name: N/A  . Number of Children: N/A  . Years of Education: N/A   Occupational History  . Not on file.   Social History Main Topics  . Smoking status: Never Smoker   . Smokeless tobacco: Never Used  . Alcohol Use: No  . Drug Use: No  . Sexual Activity: Yes    Birth Control/ Protection: None, IUD     Comment: mirena   Other Topics Concern  . Not on file   Social History Narrative   Health Maintenance  Topic Date Due  . Janet Berlin  05/06/2003  . PAP SMEAR  05/05/2005  . INFLUENZA VACCINE  09/24/2015  . HIV Screening  Completed    The following portions of the patient's history were reviewed and updated as appropriate: allergies, current medications, past family history, past medical history, past social history, past surgical history and problem list.  Review of Systems Pertinent items noted in HPI and remainder of comprehensive ROS otherwise negative.   Objective:    General appearance: alert, cooperative, appears stated age and moderately obese Neck: no adenopathy, no carotid bruit, no JVD, supple, symmetrical, trachea midline and thyroid not enlarged, symmetric, no tenderness/mass/nodules Lungs: clear to auscultation bilaterally Breasts: normal appearance, no masses or tenderness Heart: regular rate and rhythm, S1, S2 normal, no murmur, click, rub or gallop Abdomen: soft, non-tender; bowel sounds normal; no masses,  no organomegaly Pelvic: cervix normal in appearance, external genitalia normal, no adnexal masses or tenderness, no cervical motion tenderness, rectovaginal septum normal, uterus normal size, shape, and consistency, vagina normal without discharge and IUD string noted with dark red spotting     Assessment:    Healthy female exam. Obesity  BTB with Mirena Breastfeeding      Plan:  Pap and labs obtained RX for camila sent in for BTB   See After Visit Summary for Counseling Recommendations

## 2015-04-06 LAB — CBC
HEMATOCRIT: 36 % (ref 34.0–46.6)
HEMOGLOBIN: 11.7 g/dL (ref 11.1–15.9)
MCH: 25.4 pg — AB (ref 26.6–33.0)
MCHC: 32.5 g/dL (ref 31.5–35.7)
MCV: 78 fL — AB (ref 79–97)
PLATELETS: 483 10*3/uL — AB (ref 150–379)
RBC: 4.6 x10E6/uL (ref 3.77–5.28)
RDW: 14.4 % (ref 12.3–15.4)
WBC: 10 10*3/uL (ref 3.4–10.8)

## 2015-04-09 LAB — QUANTIFERON TB GOLD ASSAY (BLOOD)

## 2015-04-09 LAB — CYTOLOGY - PAP

## 2015-04-23 ENCOUNTER — Other Ambulatory Visit: Payer: 59

## 2015-04-23 ENCOUNTER — Other Ambulatory Visit: Payer: Self-pay | Admitting: Obstetrics and Gynecology

## 2015-04-23 DIAGNOSIS — Z111 Encounter for screening for respiratory tuberculosis: Secondary | ICD-10-CM | POA: Diagnosis not present

## 2015-04-25 LAB — QUANTIFERON TB GOLD ASSAY (BLOOD)

## 2015-04-25 LAB — QUANTIFERON IN TUBE
QUANTIFERON MITOGEN VALUE: 9.97 IU/mL
QUANTIFERON NIL VALUE: 0.06 [IU]/mL
QUANTIFERON TB AG VALUE: 0.04 [IU]/mL
QUANTIFERON TB GOLD: NEGATIVE

## 2015-04-26 ENCOUNTER — Encounter: Payer: Self-pay | Admitting: Obstetrics and Gynecology

## 2015-06-03 ENCOUNTER — Telehealth: Payer: 59 | Admitting: Family

## 2015-06-03 DIAGNOSIS — B9789 Other viral agents as the cause of diseases classified elsewhere: Secondary | ICD-10-CM

## 2015-06-03 DIAGNOSIS — J329 Chronic sinusitis, unspecified: Secondary | ICD-10-CM | POA: Diagnosis not present

## 2015-06-03 DIAGNOSIS — J309 Allergic rhinitis, unspecified: Secondary | ICD-10-CM

## 2015-06-03 DIAGNOSIS — B349 Viral infection, unspecified: Secondary | ICD-10-CM | POA: Diagnosis not present

## 2015-06-03 MED ORDER — FLUTICASONE PROPIONATE 50 MCG/ACT NA SUSP: 2.0000 | Freq: Every day | NASAL | Status: DC

## 2015-06-03 NOTE — Progress Notes (Signed)

## 2015-08-09 ENCOUNTER — Telehealth: Payer: 59 | Admitting: Nurse Practitioner

## 2015-08-09 DIAGNOSIS — H65191 Other acute nonsuppurative otitis media, right ear: Secondary | ICD-10-CM

## 2015-08-09 MED ORDER — AMOXICILLIN 875 MG PO TABS: 875.0000 mg | ORAL_TABLET | Freq: Two times a day (BID) | ORAL | Status: DC

## 2015-08-09 MED ORDER — NEOMYCIN-POLYMYXIN-HC 3.5-10000-1 OT SOLN: 3.0000 [drp] | Freq: Four times a day (QID) | OTIC | Status: DC

## 2015-08-09 MED ORDER — CEPHALEXIN 500 MG PO CAPS: 500.0000 mg | ORAL_CAPSULE | Freq: Four times a day (QID) | ORAL | Status: DC

## 2015-08-09 NOTE — Progress Notes (Signed)
E Visit for Swimmer's Ear  We are sorry that you are not feeling well. Here is how we plan to help!  I have prescribed: Neomycin 0.35%, polymyxin B 10,000 units/mL, and hydrocortisone 0,5% otic solution 4 drops in affected ears four times a day until completed  Based on what you have told me you may have a bacterial infection. In addition to the ear drops I have prescribed an oral antibiotic: keflex 500 1 po tid.  In certain cases swimmer's ear may progress to a more serious bacterial infection of the middle or inner ear.  If you have a fever 102 and up and significantly worsening symptoms, this could indicate a more serious infection moving to the middle/inner and needs face to face evaluation in an office by a provider.  Your symptoms should improve over the next 3 days and should resolve in about 7 days.  HOME CARE:   Wash your hands frequently.  Do not place the tip of the bottle on your ear or touch it with your fingers.  You can take Acetominophen 650 mg every 4-6 hours as needed for pain.  If pain is severe or moderate, you can apply a heating pad (set on low) or hot water bottle (wrapped in a towel) to outer ear for 20 minutes.  This will also increase drainage.  Avoid ear plugs  Do not use Q-tips  After showers, help the water run out by tilting your head to one side.  GET HELP RIGHT AWAY IF:   Fever is over 102.2 degrees.  You develop progressive ear pain or hearing loss.  Ear symptoms persist longer than 3 days after treatment.  MAKE SURE YOU:   Understand these instructions.  Will watch your condition.  Will get help right away if you are not doing well or get worse.  TO PREVENT SWIMMER'S EAR:  Use a bathing cap or custom fitted swim molds to keep your ears dry.  Towel off after swimming to dry your ears.  Tilt your head or pull your earlobes to allow the water to escape your ear canal.  If there is still water in your ears, consider using a hairdryer  on the lowest setting.  Thank you for choosing an e-visit. Your e-visit answers were reviewed by a board certified advanced clinical practitioner to complete your personal care plan. Depending upon the condition, your plan could have included both over the counter or prescription medications. Please review your pharmacy choice. Be sure that the pharmacy you have chosen is open so that you can pick up your prescription now.  If there is a problem you may message your provider in MyChart to have the prescription routed to another pharmacy. Your safety is important to us. If you have drug allergies check your prescription carefully.  For the next 24 hours, you can use MyChart to ask questions about today's visit, request a non-urgent call back, or ask for a work or school excuse from your e-visit provider. You will get an email in the next two days asking about your experience. I hope that your e-visit has been valuable and will speed your recovery.

## 2015-08-10 MED ORDER — OFLOXACIN 0.3 % OT SOLN: OTIC | Status: DC

## 2015-08-10 NOTE — Addendum Note (Signed)
Addended by: Marcelline MatesMARTIN, Aiman Noe on: 08/10/2015 08:56 PM   Modules accepted: Orders

## 2015-08-12 ENCOUNTER — Ambulatory Visit: Payer: Self-pay | Admitting: Physician Assistant

## 2015-08-12 ENCOUNTER — Encounter: Payer: Self-pay | Admitting: Physician Assistant

## 2015-08-12 VITALS — BP 120/80 | HR 80 | Temp 98.5°F

## 2015-08-12 DIAGNOSIS — H60333 Swimmer's ear, bilateral: Secondary | ICD-10-CM

## 2015-08-12 MED ORDER — CIPROFLOXACIN-DEXAMETHASONE 0.3-0.1 % OT SUSP: 4.0000 [drp] | Freq: Two times a day (BID) | OTIC | Status: DC

## 2015-08-12 NOTE — Progress Notes (Signed)
S: c/o r ear pain and swellling, has swimmers ear, had an evisit was given amoxil, did not get cortisporin drops, still painful, hurts to lie on that side  O: vitals wnl, nad, r ear tender at tragus, external canal red and swollen with exudate, unable to see tm, left ear canal a little red and irritated but not swollen, lungs c t a, cv rrr  A: acute otitis externa  P: ciprodex otic drops

## 2015-08-12 NOTE — Addendum Note (Signed)
Addended by: Faythe GheeFISHER, Jelicia Nantz W on: 08/12/2015 02:25 PM   Modules accepted: Orders

## 2015-08-19 DIAGNOSIS — H60339 Swimmer's ear, unspecified ear: Secondary | ICD-10-CM | POA: Diagnosis not present

## 2015-11-09 ENCOUNTER — Telehealth: Payer: 59 | Admitting: Physician Assistant

## 2015-11-09 DIAGNOSIS — J019 Acute sinusitis, unspecified: Secondary | ICD-10-CM

## 2015-11-09 MED ORDER — DOXYCYCLINE HYCLATE 100 MG PO CAPS: 100.0000 mg | ORAL_CAPSULE | Freq: Two times a day (BID) | ORAL | 0 refills | Status: DC

## 2015-11-09 MED ORDER — AMOXICILLIN-POT CLAVULANATE 875-125 MG PO TABS: 1.0000 | ORAL_TABLET | Freq: Two times a day (BID) | ORAL | 0 refills | Status: DC

## 2015-11-09 NOTE — Progress Notes (Signed)
Hi Michele Hurley,  If you are able to "pump and dump" while on antibiotic and have your little one get their meals from other sources, then I am comfortable with you using an antibiotic. Please follow the instructions below.  We are sorry that you are not feeling well.  Here is how we plan to help!  Based on what you have shared with me it looks like you have sinusitis.  Sinusitis is inflammation and infection in the sinus cavities of the head.  Based on your presentation I believe you most likely have Acute Bacterial Sinusitis.  This is an infection caused by bacteria and is treated with antibiotics. I have prescribed Doxycycline 100mg  by mouth twice a day for 10 days. You may use an oral decongestant such as Mucinex D or if you have glaucoma or high blood pressure use plain Mucinex. Saline nasal spray help and can safely be used as often as needed for congestion.  If you develop worsening sinus pain, fever or notice severe headache and vision changes, or if symptoms are not better after completion of antibiotic, please schedule an appointment with a health care provider.    Sinus infections are not as easily transmitted as other respiratory infection, however we still recommend that you avoid close contact with loved ones, especially the very young and elderly.  Remember to wash your hands thoroughly throughout the day as this is the number one way to prevent the spread of infection!  Home Care: Only take medications as instructed by your medical team. Complete the entire course of an antibiotic. Do not take these medications with alcohol. A steam or ultrasonic humidifier can help congestion.  You can place a towel over your head and breathe in the steam from hot water coming from a faucet. Avoid close contacts especially the very young and the elderly. Cover your mouth when you cough or sneeze. Always remember to wash your hands.  Get Help Right Away If: You develop worsening fever or sinus  pain. You develop a severe head ache or visual changes. Your symptoms persist after you have completed your treatment plan.  Make sure you Understand these instructions. Will watch your condition. Will get help right away if you are not doing well or get worse.  Your e-visit answers were reviewed by a board certified advanced clinical practitioner to complete your personal care plan.  Depending on the condition, your plan could have included both over the counter or prescription medications.  If there is a problem please reply  once you have received a response from your provider.  Your safety is important to [place].  If you have drug allergies check your prescription carefully.    You can use MyChart to ask questions about today's visit, request a non-urgent call back, or ask for a work or school excuse for 24 hours related to this e-Visit. If it has been greater than 24 hours you will need to follow up with your provider, or enter a new e-Visit to address those concerns.  You will get an e-mail in the next two days asking about your experience.  I hope that your e-visit has been valuable and will speed your recovery. Thank you for using e-visits.   ===View-only below this line===   ----- Message -----    From: Michele Hurley    Sent: 11/09/2015  7:44 PM EDT      To: E-Visit Mailing List Subject: RE: E-Visit Submission: Sinus Problems  Is there an antibiotic I  can take if needed for a sinus infection? My daughter is 68 year old and I am definitely not pregnant! I have been on augmentin and azithromycin before while breastfeeding with no problems! I am miserable and this is not going away on its own it has been 6 days.  ----- Message ----- From: Piedad Climes, PA-C Sent: 11/09/15, 7:38 PM To: Michele Hurley Subject: RE: E-Visit Submission: Sinus Problems  For the safety of you and your child, I recommend a face to face office visit with a health care provider.  Many  mothers need to take medicines during their pregnancy and while nursing.  Almost all medicines pass into the breast milk in small quantities.  Most are generally considered safe for a mother to take but some medicines must be avoided.  After reviewing your E-Visit request, I recommend that you consult your GYN or pediatrician for medical advice in relation to your condition and prescription medications while pregnant or breastfeeding.  If you are having a true medical emergency please call 911.  If you need an urgent face to face visit, [facility] has four urgent care centers for your convenience.  If you need care fast and have a high deductible or no insurance consider:   [email or web address]  [phone]  [address] [place], [place] [address] 8 am to 8 pm Monday-Friday 10 am to 4 pm Saturday-Sunday   The following sites will take your  insurance:    [facility]  [phone] Get Driving Directions Find a Provider at this Location  [address] [place], [place] [address] 10 am to 8 pm Monday-Friday 12 pm to 8 pm [facility]  [phone] Get Driving Directions Find a Provider at this Location  [address] [place], [place] [address] 8 am to 8 pm Monday-Friday 9 am to 6 pm Saturday 11 am to 6 pm Sunday   [facility] at [facility]  [phone] Get Driving Directions  [address] [place], [place] [address] 8 am to 8 pm Monday-Friday 8 am to 4 pm Saturday-Sunday   [facility] (a walk in primary care provider)  [phone]  Get Driving Directions Find a Provider at this Location  [address] [place], [place] [address] 8 am to 8:30 pm Monday-Thursday 8 am to 6 pm Friday 8 am to 4 pm Saturday-Sunday   Your e-visit answers were reviewed by a board certified advanced clinical practitioner to  complete your personal care plan.  Thank you for using e-Visits.     ----- Message -----    From: Michele Hurley    Sent: 11/09/2015  7:01 PM EDT      To: E-Visit Mailing List Subject: E-Visit Submission: Sinus Problems  E-Visit Submission: Sinus Problems --------------------------------  Question: Which of the following have you been experiencing? Answer:   Congested nose            Pain around the nose and face            Headache            Throat pain            Cough  Question: Have these symptoms significantly worsened over the last two to three days? Answer:   Yes  Question: Have you had any of the following? Answer:   None of the above  Question: How  long have you been having these symptoms? Answer:   For a few days  Question: Do you have a fever? Answer:   Yes, I have a low fever (less than 101 degrees)  Question: How long have you had the fever? Answer:   For a few days  Question: Do you smoke? Answer:   No  Question: Have you ever smoked? Answer:   I have never smoked  Question: Do you have any chronic illnesses, such as diabetes, heart disease, or lung disease, or any illness that would weaken your body's ability to fight infection? Answer:   No  Question: When you blow your nose, what color is the mucus? Answer:   Mostly thick and yellow or green  Question: Have you experienced similar problems in the past? Answer:   Yes  Question: What treatments have worked in the past?  What has not worked? Answer:   Sometimes in the past it goes away on its own after treatment with sudafed, Flonase, saline nasal mist, Zyrtec and Ibuprofen however, my symptoms started Monday and they have been progressively getting worse and cough is terrible!  Question: Is this illness similar to previous illnesses you have had?  How is it the same?  How is it different? Answer:   Started the same but never turned the corner. Usually don't get a cough this bad, now I have to  sleep sitting up a bit and I have a wet productive cough.   Question: Have you recently been hospitalized? Answer:   No  Question: What medications are you currently taking for these symptoms? Answer:   Decongestants            Pain medicine            Nose spray  Question: Please enter the names of any medications you are taking, or any other treatments you are trying. Answer:   Zyrtec, Flonase, Nettie pot with saline mixture, Motrin, Tylenol, Sudafed  Question: Are you pregnant? Answer:   I am confident that I am not pregnant  Question: Please list your medication allergies that you may have ? (If 'none' , please list as 'none') Answer:   Ceclor when younger  Question: Please enter name and complete address of your pharmacy. This ensures prescriptions are sent to the correct location. Please keep in mind that pharmacy hours vary at night/weekends/holidays.  [facility]            [address]             [place], [place] [address]  Question: Please list any additional comments  Answer:   I am breastfeeding

## 2015-11-09 NOTE — Progress Notes (Signed)
For the safety of you and your child, I recommend a face to face office visit with a health care provider.  Many mothers need to take medicines during their pregnancy and while nursing.  Almost all medicines pass into the breast milk in small quantities.  Most are generally considered safe for a mother to take but some medicines must be avoided.  After reviewing your E-Visit request, I recommend that you consult your GYN or pediatrician for medical advice in relation to your condition and prescription medications while pregnant or breastfeeding.  If you are having a true medical emergency please call 911.  If you need an urgent face to face visit, [facility] has four urgent care centers for your convenience.  If you need care fast and have a high deductible or no insurance consider:   [email or web address]  [phone]  [address] [place], [place] [address] 8 am to 8 pm Monday-Friday 10 am to 4 pm Saturday-Sunday   The following sites will take your  insurance:    . [facility]  [phone] Get Driving Directions Find a Provider at this Location  [address] [place], [place] [address] . 10 am to 8 pm Monday-Friday . 12 pm to 8 pm Saturday-Sunday   . [facility]  [phone] Get Driving Directions Find a Provider at this Location  [address] [place], [place] [address] . 8 am to 8 pm Monday-Friday . 9 am to 6 pm Saturday . 11 am to 6 pm Sunday   . [facility] at [facility] [place]  [phone] Get Driving Directions  [address] [place], [place] [address] . 8 am to 8 pm Monday-Friday . 8 am to 4 pm Saturday-Sunday   . [facility] (a walk in primary care provider)  [phone]  Get Driving Directions Find a Provider at this Location  [address] [place], [place] [address] . 8 am to 8:30 pm Monday-Thursday . 8 am to  6 pm Friday . 8 am to 4 pm Saturday-Sunday   Your e-visit answers were reviewed by a board certified advanced clinical practitioner to complete your personal care plan.  Thank you for using e-Visits.

## 2015-11-09 NOTE — Addendum Note (Signed)
Addended by: Marcelline MatesMARTIN, Rossanna Spitzley on: 11/09/2015 10:15 PM   Modules accepted: Orders

## 2015-11-09 NOTE — Addendum Note (Signed)
Addended by: Marcelline MatesMARTIN, Colburn Asper on: 11/09/2015 07:51 PM   Modules accepted: Orders

## 2016-04-07 ENCOUNTER — Encounter: Payer: 59 | Admitting: Obstetrics and Gynecology

## 2016-05-06 ENCOUNTER — Other Ambulatory Visit: Payer: Self-pay | Admitting: *Deleted

## 2016-05-06 DIAGNOSIS — B9789 Other viral agents as the cause of diseases classified elsewhere: Secondary | ICD-10-CM

## 2016-05-06 DIAGNOSIS — J329 Chronic sinusitis, unspecified: Principal | ICD-10-CM

## 2016-05-06 MED ORDER — CEFDINIR 300 MG PO CAPS: 300.0000 mg | ORAL_CAPSULE | Freq: Two times a day (BID) | ORAL | 1 refills | Status: DC

## 2016-05-06 MED ORDER — FLUTICASONE PROPIONATE 50 MCG/ACT NA SUSP: 2.0000 | Freq: Every day | NASAL | 6 refills | Status: DC

## 2018-11-30 ENCOUNTER — Other Ambulatory Visit: Payer: Self-pay | Admitting: Nurse Practitioner

## 2018-11-30 DIAGNOSIS — R635 Abnormal weight gain: Secondary | ICD-10-CM

## 2018-11-30 MED ORDER — PHENTERMINE HCL 37.5 MG PO TABS: 37.5000 mg | ORAL_TABLET | Freq: Every day | ORAL | 2 refills | Status: DC

## 2018-11-30 NOTE — Progress Notes (Signed)
Sent new prescription for phentermine 37.5mg  [facility] [address] per [facility] request.

## 2019-03-20 ENCOUNTER — Other Ambulatory Visit (HOSPITAL_COMMUNITY): Payer: Self-pay | Admitting: Nurse Practitioner

## 2019-03-20 ENCOUNTER — Telehealth (HOSPITAL_COMMUNITY): Payer: Self-pay | Admitting: Nurse Practitioner

## 2019-03-20 DIAGNOSIS — U071 COVID-19: Secondary | ICD-10-CM

## 2019-03-20 NOTE — Telephone Encounter (Signed)
Contacted patient at her mother's request for positive covid test. Per patient's mother, patient had positive test at her place of work and she believes patient would want MAB treatment if she qualified. See orders encounter notes.

## 2019-03-20 NOTE — Progress Notes (Signed)
  I connected by phone with Michele Hurley on 03/20/2019 at 6:58 PM to discuss the potential use of an new treatment for mild to moderate COVID-19 viral infection in non-hospitalized patients.  This patient is a 34 y.o. female that meets the FDA criteria for Emergency Use Authorization of bamlanivimab or casirivimab\imdevimab.  Has a (+) direct SARS-CoV-2 viral test result  Has mild or moderate COVID-19   Is ? 35 years of age and weighs ? 40 kg  Is NOT hospitalized due to COVID-19  Is NOT requiring oxygen therapy or requiring an increase in baseline oxygen flow rate due to COVID-19  Is within 10 days of symptom onset  Has at least one of the high risk factor(s) for progression to severe COVID-19 and/or hospitalization as defined in EUA.  Specific high risk criteria : BMI >/= 35   Patient had positive test outside of [facility]. She will provide a copy of this test to the infusion center either by fax or bring results on day of infusion.   I have spoken and communicated the following to the patient or parent/caregiver:  1. FDA has authorized the emergency use of bamlanivimab and casirivimab\imdevimab for the treatment of mild to moderate COVID-19 in adults and pediatric patients with positive results of direct SARS-CoV-2 viral testing who are 38 years of age and older weighing at least 40 kg, and who are at high risk for progressing to severe COVID-19 and/or hospitalization.  2. The significant known and potential risks and benefits of bamlanivimab and casirivimab\imdevimab, and the extent to which such potential risks and benefits are unknown.  3. Information on available alternative treatments and the risks and benefits of those alternatives, including clinical trials.  4. Patients treated with bamlanivimab and casirivimab\imdevimab should continue to self-isolate and use infection control measures (e.g., wear mask, isolate, social distance, avoid sharing personal items, clean and  disinfect "high touch" surfaces, and frequent handwashing) according to CDC guidelines.   5. The patient or parent/caregiver has the option to accept or refuse bamlanivimab or casirivimab\imdevimab .  After reviewing this information with the patient, The patient agreed to proceed with receiving the bamlanimivab infusion and will be provided a copy of the Fact sheet prior to receiving the infusion.Michele Hurley 03/20/2019 6:58 PM

## 2019-03-22 ENCOUNTER — Encounter (HOSPITAL_COMMUNITY): Payer: Self-pay

## 2019-03-23 ENCOUNTER — Ambulatory Visit (HOSPITAL_COMMUNITY)
Admission: RE | Admit: 2019-03-23 | Discharge: 2019-03-23 | Disposition: A | Discharge status: Home / Self Care | Payer: Managed Care, Other (non HMO) | Source: Ambulatory Visit | Attending: Pulmonary Disease | Admitting: Pulmonary Disease

## 2019-03-23 DIAGNOSIS — Z23 Encounter for immunization: Secondary | ICD-10-CM | POA: Diagnosis not present

## 2019-03-23 DIAGNOSIS — U071 COVID-19: Secondary | ICD-10-CM | POA: Diagnosis not present

## 2019-03-23 MED ORDER — METHYLPREDNISOLONE SODIUM SUCC 125 MG IJ SOLR: 125.0000 mg | Freq: Once | INTRAMUSCULAR | Status: DC | PRN

## 2019-03-23 MED ORDER — SODIUM CHLORIDE 0.9 % IV SOLN
INTRAVENOUS | Status: DC | PRN
  Administered 2019-03-23: 250 mL via INTRAVENOUS

## 2019-03-23 MED ORDER — ALBUTEROL SULFATE HFA 108 (90 BASE) MCG/ACT IN AERS: 2.0000 | INHALATION_SPRAY | Freq: Once | RESPIRATORY_TRACT | Status: DC | PRN

## 2019-03-23 MED ORDER — SODIUM CHLORIDE 0.9 % IV SOLN
700.0000 mg | Freq: Once | INTRAVENOUS | Status: AC
  Administered 2019-03-23: 09:00:00 700 mg via INTRAVENOUS
  Filled 2019-03-23: qty 20

## 2019-03-23 MED ORDER — FAMOTIDINE IN NACL 20-0.9 MG/50ML-% IV SOLN: 20.0000 mg | Freq: Once | INTRAVENOUS | Status: DC | PRN

## 2019-03-23 MED ORDER — EPINEPHRINE 0.3 MG/0.3ML IJ SOAJ: 0.3000 mg | Freq: Once | INTRAMUSCULAR | Status: DC | PRN

## 2019-03-23 MED ORDER — DIPHENHYDRAMINE HCL 50 MG/ML IJ SOLN: 50.0000 mg | Freq: Once | INTRAMUSCULAR | Status: DC | PRN

## 2019-03-23 NOTE — Progress Notes (Signed)
  Diagnosis: COVID-19  Physician:dr wright   Procedure: [facility] Med: bamlanivimab infusion - Provided patient with bamlanimivab fact sheet for patients, parents and caregivers prior to infusion.  Complications: No immediate complications noted.  Discharge: Discharged home   Michele Hurley 03/23/2019  

## 2019-03-23 NOTE — Discharge Instructions (Signed)

## 2019-03-24 ENCOUNTER — Ambulatory Visit (HOSPITAL_COMMUNITY): Payer: 59

## 2019-11-02 ENCOUNTER — Other Ambulatory Visit: Payer: Self-pay

## 2019-11-02 ENCOUNTER — Encounter: Payer: Self-pay | Admitting: Certified Nurse Midwife

## 2019-11-02 ENCOUNTER — Ambulatory Visit (INDEPENDENT_AMBULATORY_CARE_PROVIDER_SITE_OTHER): Payer: Managed Care, Other (non HMO) | Admitting: Certified Nurse Midwife

## 2019-11-02 ENCOUNTER — Other Ambulatory Visit (HOSPITAL_COMMUNITY)
Admission: RE | Admit: 2019-11-02 | Discharge: 2019-11-02 | Disposition: A | Discharge status: Home / Self Care | Payer: Managed Care, Other (non HMO) | Source: Ambulatory Visit | Attending: Certified Nurse Midwife | Admitting: Certified Nurse Midwife

## 2019-11-02 VITALS — BP 126/80 | HR 81 | Ht 66.0 in | Wt 235.5 lb

## 2019-11-02 DIAGNOSIS — Z01419 Encounter for gynecological examination (general) (routine) without abnormal findings: Secondary | ICD-10-CM

## 2019-11-02 DIAGNOSIS — Z124 Encounter for screening for malignant neoplasm of cervix: Secondary | ICD-10-CM | POA: Diagnosis not present

## 2019-11-02 NOTE — Progress Notes (Signed)
ANNUAL PREVENTATIVE CARE GYN  ENCOUNTER NOTE  Subjective:       Michele Hurley is a 34 y.o. G89P2002 female here for a routine annual gynecologic exam.  Current complaints: 1. Spotting and migraines with IUD 2. Weight gain  Denies difficulty breathing or respiratory distress, chest pain, abdominal pain, excessive vaginal bleeding, dysuria, and leg pain or swelling.    Gynecologic History  No LMP recorded. (Menstrual status: IUD).  Contraception: IUD, Mirena inserted 12/2014  Last Pap: 03/2015. Results were: Neg/Neg  Obstetric History  OB History  Gravida Para Term Preterm AB Living  2 2 2     2   SAB TAB Ectopic Multiple Live Births        0 2    # Outcome Date GA Lbr Len/2nd Weight Sex Delivery Anes PTL Lv  2 Term 10/06/14 [redacted]w[redacted]d / 00:36 9 lb 3.4 oz (4.179 kg) F Vag-Spont None  LIV  1 Term 2013    M Vag-Spont   LIV    Past Medical History:  Diagnosis Date  . GBS carrier   . Obesity   . Seasonal allergies     History reviewed. No pertinent surgical history.  Current Outpatient Medications on File Prior to Visit  Medication Sig Dispense Refill  . cetirizine (ZYRTEC) 10 MG tablet Take 10 mg by mouth daily.    . fluticasone (FLONASE) 50 MCG/ACT nasal spray Place 2 sprays into both nostrils daily. 16 g 6  . levonorgestrel (MIRENA) 20 MCG/24HR IUD 1 Intra Uterine Device (1 each total) by Intrauterine route once. 1 each 0  . amoxicillin-clavulanate (AUGMENTIN) 875-125 MG tablet Take 1 tablet by mouth 2 (two) times daily. 14 tablet 0  . cefdinir (OMNICEF) 300 MG capsule Take 1 capsule (300 mg total) by mouth 2 (two) times daily. 14 capsule 1  . docusate sodium (COLACE) 100 MG capsule Take 100 mg by mouth 2 (two) times daily. Reported on 08/12/2015 (Patient not taking: Reported on 11/02/2019)    . Iron-FA-B Cmp-C-Biot-Probiotic (FUSION PLUS) CAPS Take 1 capsule by mouth 1 day or 1 dose. (Patient not taking: Reported on 12/11/2014) 30 capsule 3  . norethindrone  (MICRONOR,CAMILA,ERRIN) 0.35 MG tablet Take 1 tablet (0.35 mg total) by mouth daily. (Patient not taking: Reported on 08/12/2015) 1 Package 2  . phentermine (ADIPEX-P) 37.5 MG tablet Take 1 tablet (37.5 mg total) by mouth daily before breakfast. 30 tablet 2  . Prenatal Vit-Fe Fumarate-FA (PRENATAL MULTIVITAMIN) TABS tablet Take 1 tablet by mouth daily at 12 noon. (Patient not taking: Reported on 11/02/2019)     No current facility-administered medications on file prior to visit.    Allergies  Allergen Reactions  . Ceclor [Cefaclor] Rash    Social History   Socioeconomic History  . Marital status: Married    Spouse name: Not on file  . Number of children: Not on file  . Years of education: Not on file  . Highest education level: Not on file  Occupational History  . Not on file  Tobacco Use  . Smoking status: Never Smoker  . Smokeless tobacco: Never Used  Vaping Use  . Vaping Use: Never used  Substance and Sexual Activity  . Alcohol use: Yes    Comment: social  . Drug use: No  . Sexual activity: Yes    Birth control/protection: I.U.D.    Comment: mirena  Other Topics Concern  . Not on file  Social History Narrative  . Not on file   Social Determinants of Health  Financial Resource Strain:   . Difficulty of Paying Living Expenses: Not on file  Food Insecurity:   . Worried About Programme researcher, broadcasting/film/video in the Last Year: Not on file  . Ran Out of Food in the Last Year: Not on file  Transportation Needs:   . Lack of Transportation (Medical): Not on file  . Lack of Transportation (Non-Medical): Not on file  Physical Activity:   . Days of Exercise per Week: Not on file  . Minutes of Exercise per Session: Not on file  Stress:   . Feeling of Stress : Not on file  Social Connections:   . Frequency of Communication with Friends and Family: Not on file  . Frequency of Social Gatherings with Friends and Family: Not on file  . Attends Religious Services: Not on file  . Active  Member of Clubs or Organizations: Not on file  . Attends Banker Meetings: Not on file  . Marital Status: Not on file  Intimate Partner Violence:   . Fear of Current or Ex-Partner: Not on file  . Emotionally Abused: Not on file  . Physically Abused: Not on file  . Sexually Abused: Not on file    Family History  Problem Relation Age of Onset  . Healthy Mother   . Healthy Father   . Healthy Maternal Grandmother   . Hypertension Maternal Grandfather   . Heart failure Maternal Grandfather   . Hypertension Paternal Grandmother     The following portions of the patient's history were reviewed and updated as appropriate: allergies, current medications, past family history, past medical history, past social history, past surgical history and problem list.  Review of Systems  ROS negative except as noted above. Information obtained from patient.    Objective:   BP 126/80   Pulse 81   Ht 5\' 6"  (1.676 m)   Wt 235 lb 8 oz (106.8 kg)   BMI 38.01 kg/m    CONSTITUTIONAL: Well-developed, well-nourished female in no acute distress.   PSYCHIATRIC: Normal mood and affect. Normal behavior. Normal judgment and thought content.  NEUROLGIC: Alert and oriented to person, place, and time. Normal muscle tone coordination. No cranial nerve deficit noted.  HENT:  Normocephalic, atraumatic, External right and left ear normal.   EYES: Conjunctivae and EOM are normal. Pupils are equal and round.   NECK: Normal range of motion, supple, no masses.  Normal thyroid.   SKIN: Skin is warm and dry. No rash noted. Not diaphoretic. No  erythema. No pallor.  CARDIOVASCULAR: Normal heart rate noted, regular rhythm, no murmur.  RESPIRATORY: Clear to auscultation bilaterally. Effort and breath sounds normal, no problems with respiration noted.  BREASTS: Symmetric in size. No masses, skin changes, nipple drainage, or lymphadenopathy.  ABDOMEN: Soft, normal bowel sounds, no distention noted.   No tenderness, rebound or guarding.   PELVIC:  External Genitalia: Normal  Vagina: Normal  Cervix: Normal, IUD strings present, Pap collected  Uterus: Normal  Adnexa: Normal  MUSCULOSKELETAL: Normal range of motion. No tenderness.  No cyanosis, clubbing, or edema.  2+ distal pulses.  LYMPHATIC: No Axillary, Supraclavicular, or Inguinal Adenopathy.  Assessment:   Annual gynecologic examination 34 y.o.   Contraception: IUD   Obesity 2   Problem List Items Addressed This Visit    None    Visit Diagnoses    Well woman exam    -  Primary   Relevant Orders   Cytology - PAP   Screening for cervical cancer  Relevant Orders   Cytology - PAP      Plan:   Pap: Pap Co Test  Labs: List given, will get through work   Information regarding medical weight loss sent via USPS  Routine preventative health maintenance measures emphasized: Exercise/Diet/Weight control, Tobacco Warnings, Alcohol/Substance use risks and Stress Management; see AVS  Reviewed red flag symptoms and when to call  Return to Clinic for IUD removal and reinsertion or sooner if needed   Serafina Royals, CNM  [facility], [facility] 11/02/19 10:20 AM

## 2019-11-02 NOTE — Patient Instructions (Signed)
Preventive Care 35-35 Years Old, Female Preventive care refers to visits with your health care provider and lifestyle choices that can promote health and wellness. This includes:  A yearly physical exam. This may also be called an annual well check.  Regular dental visits and eye exams.  Immunizations.  Screening for certain conditions.  Healthy lifestyle choices, such as eating a healthy diet, getting regular exercise, not using drugs or products that contain nicotine and tobacco, and limiting alcohol use. What can I expect for my preventive care visit? Physical exam Your health care provider will check your:  Height and weight. This may be used to calculate body mass index (BMI), which tells if you are at a healthy weight.  Heart rate and blood pressure.  Skin for abnormal spots. Counseling Your health care provider may ask you questions about your:  Alcohol, tobacco, and drug use.  Emotional well-being.  Home and relationship well-being.  Sexual activity.  Eating habits.  Work and work Statistician.  Method of birth control.  Menstrual cycle.  Pregnancy history. What immunizations do I need?  Influenza (flu) vaccine  This is recommended every year. Tetanus, diphtheria, and pertussis (Tdap) vaccine  You may need a Td booster every 10 years. Varicella (chickenpox) vaccine  You may need this if you have not been vaccinated. Human papillomavirus (HPV) vaccine  If recommended by your health care provider, you may need three doses over 6 months. Measles, mumps, and rubella (MMR) vaccine  You may need at least one dose of MMR. You may also need a second dose. Meningococcal conjugate (MenACWY) vaccine  One dose is recommended if you are age 31-21 years and a first-year college student living in a residence hall, or if you have one of several medical conditions. You may also need additional booster doses. Pneumococcal conjugate (PCV13) vaccine  You may need  this if you have certain conditions and were not previously vaccinated. Pneumococcal polysaccharide (PPSV23) vaccine  You may need one or two doses if you smoke cigarettes or if you have certain conditions. Hepatitis A vaccine  You may need this if you have certain conditions or if you travel or work in places where you may be exposed to hepatitis A. Hepatitis B vaccine  You may need this if you have certain conditions or if you travel or work in places where you may be exposed to hepatitis B. Haemophilus influenzae type b (Hib) vaccine  You may need this if you have certain conditions. You may receive vaccines as individual doses or as more than one vaccine together in one shot (combination vaccines). Talk with your health care provider about the risks and benefits of combination vaccines. What tests do I need?  Blood tests  Lipid and cholesterol levels. These may be checked every 5 years starting at age 35.  Hepatitis C test.  Hepatitis B test. Screening  Diabetes screening. This is done by checking your blood sugar (glucose) after you have not eaten for a while (fasting).  Sexually transmitted disease (STD) testing.  BRCA-related cancer screening. This may be done if you have a family history of breast, ovarian, tubal, or peritoneal cancers.  Pelvic exam and Pap test. This may be done every 3 years starting at age 35. Starting at age 70, this may be done every 5 years if you have a Pap test in combination with an HPV test. Talk with your health care provider about your test results, treatment options, and if necessary, the need for more tests.  Follow these instructions at home: Eating and drinking   Eat a diet that includes fresh fruits and vegetables, whole grains, lean protein, and low-fat dairy.  Take vitamin and mineral supplements as recommended by your health care provider.  Do not drink alcohol if: ? Your health care provider tells you not to drink. ? You are  pregnant, may be pregnant, or are planning to become pregnant.  If you drink alcohol: ? Limit how much you have to 0-1 drink a day. ? Be aware of how much alcohol is in your drink. In the [place] one drink equals one 12 oz bottle of beer (355 mL), one 5 oz glass of wine (148 mL), or one 1 oz glass of hard liquor (44 mL). Lifestyle  Take daily care of your teeth and gums.  Stay active. Exercise for at least 30 minutes on 5 or more days each week.  Do not use any products that contain nicotine or tobacco, such as cigarettes, e-cigarettes, and chewing tobacco. If you need help quitting, ask your health care provider.  If you are sexually active, practice safe sex. Use a condom or other form of birth control (contraception) in order to prevent pregnancy and STIs (sexually transmitted infections). If you plan to become pregnant, see your health care provider for a preconception visit. What's next?  Visit your health care provider once a year for a well check visit.  Ask your health care provider how often you should have your eyes and teeth checked.  Stay up to date on all vaccines. This information is not intended to replace advice given to you by your health care provider. Make sure you discuss any questions you have with your health care provider. Document Revised: 10/21/2017 Document Reviewed: 10/21/2017 Elsevier Patient Education  [place].    Levonorgestrel intrauterine device (IUD) What is this medicine? LEVONORGESTREL IUD (LEE voe nor jes trel) is a contraceptive (birth control) device. The device is placed inside the uterus by a healthcare professional. It is used to prevent pregnancy. This device can also be used to treat heavy bleeding that occurs during your period. This medicine may be used for other purposes; ask your health care provider or pharmacist if you have questions. COMMON BRAND NAME(S): Minette Headland What should I tell my health care  provider before I take this medicine? They need to know if you have any of these conditions:  abnormal Pap smear  cancer of the breast, uterus, or cervix  diabetes  endometritis  genital or pelvic infection now or in the past  have more than one sexual partner or your partner has more than one partner  heart disease  history of an ectopic or tubal pregnancy  immune system problems  IUD in place  liver disease or tumor  problems with blood clots or take blood-thinners  seizures  use intravenous drugs  uterus of unusual shape  vaginal bleeding that has not been explained  an unusual or allergic reaction to levonorgestrel, other hormones, silicone, or polyethylene, medicines, foods, dyes, or preservatives  pregnant or trying to get pregnant  breast-feeding How should I use this medicine? This device is placed inside the uterus by a health care professional. Talk to your pediatrician regarding the use of this medicine in children. Special care may be needed. Overdosage: If you think you have taken too much of this medicine contact a poison control center or emergency room at once. NOTE: This medicine is only for you. Do not  share this medicine with others. What if I miss a dose? This does not apply. Depending on the brand of device you have inserted, the device will need to be replaced every 3 to 6 years if you wish to continue using this type of birth control. What may interact with this medicine? Do not take this medicine with any of the following medications:  amprenavir  bosentan  fosamprenavir This medicine may also interact with the following medications:  aprepitant  armodafinil  barbiturate medicines for inducing sleep or treating seizures  bexarotene  boceprevir  griseofulvin  medicines to treat seizures like carbamazepine, ethotoin, felbamate, oxcarbazepine, phenytoin,  topiramate  modafinil  pioglitazone  rifabutin  rifampin  rifapentine  some medicines to treat HIV infection like atazanavir, efavirenz, indinavir, lopinavir, nelfinavir, tipranavir, ritonavir  St. John's wort  warfarin This list may not describe all possible interactions. Give your health care provider a list of all the medicines, herbs, non-prescription drugs, or dietary supplements you use. Also tell them if you smoke, drink alcohol, or use illegal drugs. Some items may interact with your medicine. What should I watch for while using this medicine? Visit your doctor or health care professional for regular check ups. See your doctor if you or your partner has sexual contact with others, becomes HIV positive, or gets a sexual transmitted disease. This product does not protect you against HIV infection (AIDS) or other sexually transmitted diseases. You can check the placement of the IUD yourself by reaching up to the top of your vagina with clean fingers to feel the threads. Do not pull on the threads. It is a good habit to check placement after each menstrual period. Call your doctor right away if you feel more of the IUD than just the threads or if you cannot feel the threads at all. The IUD may come out by itself. You may become pregnant if the device comes out. If you notice that the IUD has come out use a backup birth control method like condoms and call your health care provider. Using tampons will not change the position of the IUD and are okay to use during your period. This IUD can be safely scanned with magnetic resonance imaging (MRI) only under specific conditions. Before you have an MRI, tell your healthcare provider that you have an IUD in place, and which type of IUD you have in place. What side effects may I notice from receiving this medicine? Side effects that you should report to your doctor or health care professional as soon as possible:  allergic reactions like skin  rash, itching or hives, swelling of the face, lips, or tongue  fever, flu-like symptoms  genital sores  high blood pressure  no menstrual period for 6 weeks during use  pain, swelling, warmth in the leg  pelvic pain or tenderness  severe or sudden headache  signs of pregnancy  stomach cramping  sudden shortness of breath  trouble with balance, talking, or walking  unusual vaginal bleeding, discharge  yellowing of the eyes or skin Side effects that usually do not require medical attention (report to your doctor or health care professional if they continue or are bothersome):  acne  breast pain  change in sex drive or performance  changes in weight  cramping, dizziness, or faintness while the device is being inserted  headache  irregular menstrual bleeding within first 3 to 6 months of use  nausea This list may not describe all possible side effects. Call your doctor  for medical advice about side effects. You may report side effects to FDA at 1-800-FDA-1088. Where should I keep my medicine? This does not apply. NOTE: This sheet is a summary. It may not cover all possible information. If you have questions about this medicine, talk to your doctor, pharmacist, or health care provider.  2020 Elsevier/Gold Standard (2017-12-21 13:22:01)  

## 2019-11-03 LAB — CYTOLOGY - PAP
Comment: NEGATIVE
Diagnosis: NEGATIVE
High risk HPV: NEGATIVE

## 2020-11-15 ENCOUNTER — Ambulatory Visit
Admission: RE | Admit: 2020-11-15 | Discharge: 2020-11-15 | Disposition: A | Discharge status: Home / Self Care | Payer: 59 | Source: Ambulatory Visit | Attending: Emergency Medicine | Admitting: Emergency Medicine

## 2020-11-15 ENCOUNTER — Other Ambulatory Visit: Payer: Self-pay | Admitting: Emergency Medicine

## 2020-11-15 ENCOUNTER — Other Ambulatory Visit: Payer: Self-pay

## 2020-11-15 DIAGNOSIS — N201 Calculus of ureter: Secondary | ICD-10-CM | POA: Diagnosis not present

## 2020-11-15 DIAGNOSIS — M545 Low back pain, unspecified: Secondary | ICD-10-CM

## 2020-11-15 DIAGNOSIS — N1330 Unspecified hydronephrosis: Secondary | ICD-10-CM | POA: Diagnosis not present

## 2021-05-13 ENCOUNTER — Encounter: Payer: Self-pay | Admitting: Obstetrics and Gynecology

## 2021-06-11 ENCOUNTER — Other Ambulatory Visit: Payer: Self-pay

## 2021-06-11 MED ORDER — FLUTICASONE PROPIONATE 50 MCG/ACT NA SUSP
NASAL | 11 refills | Status: AC
  Filled 2021-06-11: qty 16, 30d supply, fill #0

## 2021-06-19 ENCOUNTER — Other Ambulatory Visit: Payer: Self-pay

## 2021-07-04 ENCOUNTER — Other Ambulatory Visit: Payer: Self-pay

## 2021-07-04 MED ORDER — EPINEPHRINE 0.3 MG/0.3ML IJ SOAJ
INTRAMUSCULAR | 0 refills | Status: AC
  Filled 2021-07-04: qty 2, 30d supply, fill #0

## 2021-07-31 ENCOUNTER — Other Ambulatory Visit: Payer: Self-pay

## 2022-02-05 ENCOUNTER — Telehealth: Payer: Self-pay | Admitting: Family Medicine

## 2022-02-05 DIAGNOSIS — J329 Chronic sinusitis, unspecified: Secondary | ICD-10-CM

## 2022-02-05 DIAGNOSIS — J069 Acute upper respiratory infection, unspecified: Secondary | ICD-10-CM

## 2022-02-05 MED ORDER — AZITHROMYCIN 250 MG PO TABS: ORAL_TABLET | ORAL | 0 refills | Status: AC

## 2022-02-05 MED ORDER — PROMETHAZINE-DM 6.25-15 MG/5ML PO SYRP: 5.0000 mL | ORAL_SOLUTION | Freq: Four times a day (QID) | ORAL | 0 refills | Status: DC | PRN

## 2022-02-05 MED ORDER — BENZONATATE 100 MG PO CAPS: 100.0000 mg | ORAL_CAPSULE | Freq: Two times a day (BID) | ORAL | 0 refills | Status: DC | PRN

## 2022-02-05 NOTE — Progress Notes (Signed)
We are sorry that you are not feeling well.  Here is how we plan to help!  Based on your presentation I believe you most likely have A cough due to allergies.  I recommend that you start the an over-the counter-allergy medication such as Claritin 10 mg or Zyrtec 10 mg daily.     In addition you may use A prescription cough medication called Tessalon Perles 100mg . You may take 1-2 capsules every 8 hours as needed for your cough. I will also order a cough syrup to help with congestion and cough. Promethazine DM it can make you sleepy.    From your responses in the eVisit questionnaire you describe inflammation in the upper respiratory tract which is causing a significant cough.  This is commonly called Bronchitis and has four common causes:   Allergies Viral Infections Acid Reflux Bacterial Infection Allergies, viruses and acid reflux are treated by controlling symptoms or eliminating the cause. An example might be a cough caused by taking certain blood pressure medications. You stop the cough by changing the medication. Another example might be a cough caused by acid reflux. Controlling the reflux helps control the cough.  USE OF BRONCHODILATOR ("RESCUE") INHALERS: There is a risk from using your bronchodilator too frequently.  The risk is that over-reliance on a medication which only relaxes the muscles surrounding the breathing tubes can reduce the effectiveness of medications prescribed to reduce swelling and congestion of the tubes themselves.  Although you feel brief relief from the bronchodilator inhaler, your asthma may actually be worsening with the tubes becoming more swollen and filled with mucus.  This can delay other crucial treatments, such as oral steroid medications. If you need to use a bronchodilator inhaler daily, several times per day, you should discuss this with your provider.  There are probably better treatments that could be used to keep your asthma under control.     HOME  CARE Only take medications as instructed by your medical team. Complete the entire course of an antibiotic. Drink plenty of fluids and get plenty of rest. Avoid close contacts especially the very young and the elderly Cover your mouth if you cough or cough into your sleeve. Always remember to wash your hands A steam or ultrasonic humidifier can help congestion.   GET HELP RIGHT AWAY IF: You develop worsening fever. You become short of breath You cough up blood. Your symptoms persist after you have completed your treatment plan MAKE SURE YOU  Understand these instructions. Will watch your condition. Will get help right away if you are not doing well or get worse.    Thank you for choosing an e-visit.  Your e-visit answers were reviewed by a board certified advanced clinical practitioner to complete your personal care plan. Depending upon the condition, your plan could have included both over the counter or prescription medications.  Please review your pharmacy choice. Make sure the pharmacy is open so you can pick up prescription now. If there is a problem, you may contact your provider through and have the prescription routed to another pharmacy.  Your safety is important to [employer]. If you have drug allergies check your prescription carefully.   For the next 24 hours you can use MyChart to ask questions about today's visit, request a non-urgent call back, or ask for a work or school excuse. You will get an email in the next two days asking about your experience. I hope that your e-visit has been valuable and will  speed your recovery.  I provided 5 minutes of non face-to-face time during this encounter for chart review, medication and order placement, as well as and documentation.

## 2022-02-05 NOTE — Addendum Note (Signed)
Addended by: Freddy Finner on: 02/05/2022 09:27 AM   Modules accepted: Orders

## 2022-02-17 ENCOUNTER — Telehealth: Payer: Self-pay | Admitting: Family Medicine

## 2022-02-17 DIAGNOSIS — J329 Chronic sinusitis, unspecified: Secondary | ICD-10-CM

## 2022-02-17 NOTE — Progress Notes (Signed)
[place]

## 2023-10-03 ENCOUNTER — Telehealth: Admitting: Physician Assistant

## 2023-10-03 DIAGNOSIS — B9689 Other specified bacterial agents as the cause of diseases classified elsewhere: Secondary | ICD-10-CM

## 2023-10-03 DIAGNOSIS — J019 Acute sinusitis, unspecified: Secondary | ICD-10-CM | POA: Diagnosis not present

## 2023-10-03 DIAGNOSIS — H6501 Acute serous otitis media, right ear: Secondary | ICD-10-CM

## 2023-10-04 MED ORDER — AMOXICILLIN-POT CLAVULANATE 875-125 MG PO TABS: 1.0000 | ORAL_TABLET | Freq: Two times a day (BID) | ORAL | 0 refills | Status: AC

## 2023-10-04 MED ORDER — CIPROFLOXACIN-DEXAMETHASONE 0.3-0.1 % OT SUSP: 4.0000 [drp] | Freq: Two times a day (BID) | OTIC | 0 refills | Status: AC

## 2023-10-04 NOTE — Progress Notes (Signed)
 E-Visit for Ear Pain - Acute Otitis Media   We are sorry that you are not feeling well. Here is how we plan to help!  Based on what you have shared with me it looks like you have Acute Otitis Media and Sinusitis.  Acute Otitis Media is an infection of the middle or inner ear. This type of infection can cause redness, inflammation, and fluid buildup behind the tympanic membrane (ear drum).  The usual symptoms include: Earache/Pain Fever Upper respiratory symptoms Lack of energy/Fatigue/Malaise Slight hearing loss gradually worsening- if the inner ear fills with fluid What causes middle ear infections? Most middle ear infections occur when an infection such as a cold, leads to a build-up of mucus in the middle ear and causes the Eustachian tube (a thin tube that runs from the middle ear to the back of the nose) to become swollen or blocked.   This means mucus can't drain away properly, making it easier for an infection to spread into the middle ear.  How middle ear infections are treated: Most ear infections clear up within three to five days and don't need any specific treatment. If necessary, tylenol  or ibuprofen  should be used to relieve pain and a high temperature.  If you develop a fever higher than 102, or any significantly worsening symptoms, this could indicate a more serious infection moving to the middle/inner and needs face to face evaluation in an office by a provider.   Antibiotics aren't routinely used to treat middle ear infections, although they may occasionally be prescribed if symptoms persist or are particularly severe.  Given your presentation,   I have prescribed Augmentin  875-125 mg one tablet by mouth twice a day for 10 days  I have prescribed: Ciprofloxacin  0.3% and dexamethasone  0.1% otic suspension four drops in affected ears two times a day for 7 days   Your symptoms should improve over the next 3 days and should resolve in about 7 days. Be sure to complete ALL  of the prescription(s) given.  HOME CARE: Wash your hands frequently. If you are prescribed an ear drop, do not place the tip of the bottle on your ear or touch it with your fingers. You can take Acetaminophen  650 mg every 4-6 hours as needed for pain.  If pain is severe or moderate, you can apply a heating pad (set on low) or hot water bottle (wrapped in a towel) to outer ear for 20 minutes.  This will also increase drainage.  GET HELP RIGHT AWAY IF: Fever is over 102.2 degrees. You develop progressive ear pain or hearing loss. Ear symptoms persist longer than 3 days after treatment.  MAKE SURE YOU: Understand these instructions. Will watch your condition. Will get help right away if you are not doing well or get worse.  Thank you for choosing an e-visit.  Your e-visit answers were reviewed by a board certified advanced clinical practitioner to complete your personal care plan. Depending upon the condition, your plan could have included both over the counter or prescription medications.  Please review your pharmacy choice. Make sure the pharmacy is open so you can pick up the prescription now. If there is a problem, you may contact your provider through [employer] and have the prescription routed to another pharmacy.  Your safety is important to us . If you have drug allergies check your prescription carefully.   For the next 24 hours you can use MyChart to ask questions about today's visit, request a non-urgent call back, or  ask for a work or school excuse. You will get an email with a survey after your eVisit asking about your experience. We would appreciate your feedback. I hope that your e-visit has been valuable and will aid in your recovery.    I have spent 5 minutes in review of e-visit questionnaire, review and updating patient chart, medical decision making and response to patient.   Delon CHRISTELLA Dickinson, PA-C
